# Patient Record
Sex: Female | Born: 1975 | Race: White | Hispanic: No | Marital: Married | Smoking: Never smoker
Health system: Southern US, Community
[De-identification: ages and names within clinical notes are randomized; demographics above are authoritative.]

## PROBLEM LIST (undated history)

## (undated) DIAGNOSIS — E785 Hyperlipidemia, unspecified: Secondary | ICD-10-CM

## (undated) DIAGNOSIS — I10 Essential (primary) hypertension: Secondary | ICD-10-CM

## (undated) HISTORY — DX: Essential (primary) hypertension: I10

## (undated) HISTORY — DX: Hyperlipidemia, unspecified: E78.5

---

## 1999-06-14 ENCOUNTER — Ambulatory Visit: Admission: RE | Admit: 1999-06-14 | Discharge: 1999-06-14 | Payer: Self-pay | Admitting: Pulmonary Disease

## 2012-10-05 ENCOUNTER — Telehealth: Payer: Self-pay | Admitting: Nurse Practitioner

## 2012-10-05 NOTE — Telephone Encounter (Signed)
APPT MADE FOR TOMORROW AM WITH [facility]

## 2012-10-06 ENCOUNTER — Ambulatory Visit (INDEPENDENT_AMBULATORY_CARE_PROVIDER_SITE_OTHER): Payer: BC Managed Care – PPO | Admitting: General Practice

## 2012-10-06 ENCOUNTER — Ambulatory Visit (INDEPENDENT_AMBULATORY_CARE_PROVIDER_SITE_OTHER): Payer: BC Managed Care – PPO

## 2012-10-06 ENCOUNTER — Encounter: Payer: Self-pay | Admitting: General Practice

## 2012-10-06 VITALS — BP 136/81 | HR 76 | Temp 96.8°F | Ht 63.0 in | Wt 236.0 lb

## 2012-10-06 DIAGNOSIS — M25569 Pain in unspecified knee: Secondary | ICD-10-CM

## 2012-10-06 DIAGNOSIS — M25561 Pain in right knee: Secondary | ICD-10-CM

## 2012-10-06 MED ORDER — LISINOPRIL 10 MG PO TABS: 10.0000 mg | ORAL_TABLET | Freq: Every day | ORAL | Status: DC

## 2012-10-06 MED ORDER — IBUPROFEN 600 MG PO TABS: 600.0000 mg | ORAL_TABLET | Freq: Two times a day (BID) | ORAL | Status: DC | PRN

## 2012-10-06 MED ORDER — SIMVASTATIN 40 MG PO TABS: 40.0000 mg | ORAL_TABLET | Freq: Every evening | ORAL | Status: DC

## 2012-10-06 NOTE — Progress Notes (Signed)
  Subjective:    Patient ID: Tamara Macias, female    DOB: Nov 09, 1975, 37 y.o.   MRN: [id number]  Knee Pain  The incident occurred more than 1 week ago. The incident occurred at home. The injury mechanism was a fall. The pain is present in the right knee. The quality of the pain is described as aching (sharp at times). The pain is at a severity of 7/10. The pain is moderate. The pain has been constant since onset. Associated symptoms include an inability to bear weight, muscle weakness and numbness. Pertinent negatives include no loss of motion. The symptoms are aggravated by movement. She has tried NSAIDs and heat for the symptoms. The treatment provided mild relief.      Review of Systems  Constitutional: Negative for fever and chills.  Respiratory: Negative for chest tightness and shortness of breath.   Cardiovascular: Negative for chest pain and palpitations.  Musculoskeletal: Negative for back pain and joint swelling.       Right knee pain  Skin: Negative.  Negative for rash.  Neurological: Positive for numbness.       Objective:   Physical Exam  Constitutional: She is oriented to person, place, and time. She appears well-developed and well-nourished.  Cardiovascular: Normal rate, regular rhythm and normal heart sounds.   Pulmonary/Chest: Effort normal and breath sounds normal. No respiratory distress. She exhibits no tenderness.  Musculoskeletal: She exhibits tenderness.       Right knee: She exhibits normal range of motion, no swelling and no erythema. Tenderness found.  Right knee pain area pain with flexion and extension of knee. Tenderness to inner knee along and hamstring area upon palpation  Neurological: She is alert and oriented to person, place, and time.  Skin: Skin is warm and dry.  Psychiatric: She has a normal mood and affect.   [facility] reading (PRIMARY) by Ruthell Rummage, FNP-C, no dislocation or fracture noted.                                      Assessment &  Plan:  Take medications as prescribed Rest, ice, elevate, and compression wrap on right knee RTO if symptoms worsens or unresolved in 2 weeks Patient verbalized understanding and denies questions   Raymon Mutton, FNP-C

## 2012-10-06 NOTE — Patient Instructions (Addendum)
Muscle Strain Muscle strain occurs when a muscle is stretched beyond its normal length. A small number of muscle fibers generally are torn. This is especially common in athletes. This happens when a sudden, violent force placed on a muscle stretches it too far. Usually, recovery from muscle strain takes 1 to 2 weeks. Complete healing will take 5 to 6 weeks.  HOME CARE INSTRUCTIONS   While awake, apply ice to the sore muscle for the first 2 days after the injury.  Put ice in a plastic bag.  Place a towel between your skin and the bag.  Leave the ice on for 15 to 20 minutes each hour.  Do not use the strained muscle for several days, until you no longer have pain.  You may wrap the injured area with an elastic bandage for comfort. Be careful not to wrap it too tightly. This may interfere with blood circulation or increase swelling.  Only take over-the-counter or prescription medicines for pain, discomfort, or fever as directed by your caregiver. SEEK MEDICAL CARE IF:  You have increasing pain or swelling in the injured area. MAKE SURE YOU:   Understand these instructions.  Will watch your condition.  Will get help right away if you are not doing well or get worse. Document Released: 06/17/2005 Document Revised: 09/09/2011 Document Reviewed: 06/29/2011 [facility] Patient Information 2013 [place], [place]. Knee Pain The knee is the complex joint between your thigh and your lower leg. It is made up of bones, tendons, ligaments, and cartilage. The bones that make up the knee are:  The femur in the thigh.  The tibia and fibula in the lower leg.  The patella or kneecap riding in the groove on the lower femur. CAUSES  Knee pain is a common complaint with many causes. A few of these causes are:  Injury, such as:  A ruptured ligament or tendon injury.  Torn cartilage.  Medical conditions, such as:  Gout  Arthritis  Infections  Overuse, over training or overdoing a physical  activity. Knee pain can be minor or severe. Knee pain can accompany debilitating injury. Minor knee problems often respond well to self-care measures or get well on their own. More serious injuries may need medical intervention or even surgery. SYMPTOMS The knee is complex. Symptoms of knee problems can vary widely. Some of the problems are:  Pain with movement and weight bearing.  Swelling and tenderness.  Buckling of the knee.  Inability to straighten or extend your knee.  Your knee locks and you cannot straighten it.  Warmth and redness with pain and fever.  Deformity or dislocation of the kneecap. DIAGNOSIS  Determining what is wrong may be very straight forward such as when there is an injury. It can also be challenging because of the complexity of the knee. Tests to make a diagnosis may include:  Your caregiver taking a history and doing a physical exam.  Routine X-rays can be used to rule out other problems. X-rays will not reveal a cartilage tear. Some injuries of the knee can be diagnosed by:  Arthroscopy a surgical technique by which a small video camera is inserted through tiny incisions on the sides of the knee. This procedure is used to examine and repair internal knee joint problems. Tiny instruments can be used during arthroscopy to repair the torn knee cartilage (meniscus).  Arthrography is a radiology technique. A contrast liquid is directly injected into the knee joint. Internal structures of the knee joint then become visible on X-ray  film.  An MRI scan is a non x-ray radiology procedure in which magnetic fields and a computer produce two- or three-dimensional images of the inside of the knee. Cartilage tears are often visible using an MRI scanner. MRI scans have largely replaced arthrography in diagnosing cartilage tears of the knee.  Blood work.  Examination of the fluid that helps to lubricate the knee joint (synovial fluid). This is done by taking a sample out  using a needle and a syringe. TREATMENT The treatment of knee problems depends on the cause. Some of these treatments are:  Depending on the injury, proper casting, splinting, surgery or physical therapy care will be needed.  Give yourself adequate recovery time. Do not overuse your joints. If you begin to get sore during workout routines, back off. Slow down or do fewer repetitions.  For repetitive activities such as cycling or running, maintain your strength and nutrition.  Alternate muscle groups. For example if you are a weight lifter, work the upper body on one day and the lower body the next.  Either tight or weak muscles do not give the proper support for your knee. Tight or weak muscles do not absorb the stress placed on the knee joint. Keep the muscles surrounding the knee strong.  Take care of mechanical problems.  If you have flat feet, orthotics or special shoes may help. See your caregiver if you need help.  Arch supports, sometimes with wedges on the inner or outer aspect of the heel, can help. These can shift pressure away from the side of the knee most bothered by osteoarthritis.  A brace called an "unloader" brace also may be used to help ease the pressure on the most arthritic side of the knee.  If your caregiver has prescribed crutches, braces, wraps or ice, use as directed. The acronym for this is PRICE. This means protection, rest, ice, compression and elevation.  Nonsteroidal anti-inflammatory drugs (NSAID's), can help relieve pain. But if taken immediately after an injury, they may actually increase swelling. Take NSAID's with food in your stomach. Stop them if you develop stomach problems. Do not take these if you have a history of ulcers, stomach pain or bleeding from the bowel. Do not take without your caregiver's approval if you have problems with fluid retention, heart failure, or kidney problems.  For ongoing knee problems, physical therapy may be  helpful.  Glucosamine and chondroitin are over-the-counter dietary supplements. Both may help relieve the pain of osteoarthritis in the knee. These medicines are different from the usual anti-inflammatory drugs. Glucosamine may decrease the rate of cartilage destruction.  Injections of a corticosteroid drug into your knee joint may help reduce the symptoms of an arthritis flare-up. They may provide pain relief that lasts a few months. You may have to wait a few months between injections. The injections do have a small increased risk of infection, water retention and elevated blood sugar levels.  Hyaluronic acid injected into damaged joints may ease pain and provide lubrication. These injections may work by reducing inflammation. A series of shots may give relief for as long as 6 months.  Topical painkillers. Applying certain ointments to your skin may help relieve the pain and stiffness of osteoarthritis. Ask your pharmacist for suggestions. Many over the-counter products are approved for temporary relief of arthritis pain.  In some countries, doctors often prescribe topical NSAID's for relief of chronic conditions such as arthritis and tendinitis. A review of treatment with NSAID creams found that they worked as  well as oral medications but without the serious side effects. PREVENTION  Maintain a healthy weight. Extra pounds put more strain on your joints.  Get strong, stay limber. Weak muscles are a common cause of knee injuries. Stretching is important. Include flexibility exercises in your workouts.  Be smart about exercise. If you have osteoarthritis, chronic knee pain or recurring injuries, you may need to change the way you exercise. This does not mean you have to stop being active. If your knees ache after jogging or playing basketball, consider switching to swimming, water aerobics or other low-impact activities, at least for a few days a week. Sometimes limiting high-impact activities will  provide relief.  Make sure your shoes fit well. Choose footwear that is right for your sport.  Protect your knees. Use the proper gear for knee-sensitive activities. Use kneepads when playing volleyball or laying carpet. Buckle your seat belt every time you drive. Most shattered kneecaps occur in car accidents.  Rest when you are tired. SEEK MEDICAL CARE IF:  You have knee pain that is continual and does not seem to be getting better.  SEEK IMMEDIATE MEDICAL CARE IF:  Your knee joint feels hot to the touch and you have a high fever. MAKE SURE YOU:   Understand these instructions.  Will watch your condition.  Will get help right away if you are not doing well or get worse. Document Released: 04/14/2007 Document Revised: 09/09/2011 Document Reviewed: 04/14/2007 [facility] Patient Information 2013 [place], [place].

## 2012-11-10 ENCOUNTER — Telehealth: Payer: Self-pay | Admitting: Nurse Practitioner

## 2012-11-10 NOTE — Telephone Encounter (Signed)
APPT MADE

## 2012-11-11 ENCOUNTER — Ambulatory Visit (INDEPENDENT_AMBULATORY_CARE_PROVIDER_SITE_OTHER): Payer: BC Managed Care – PPO | Admitting: General Practice

## 2012-11-11 ENCOUNTER — Encounter: Payer: Self-pay | Admitting: General Practice

## 2012-11-11 VITALS — BP 131/84 | HR 103 | Temp 98.8°F | Ht 63.0 in | Wt 235.0 lb

## 2012-11-11 DIAGNOSIS — M25561 Pain in right knee: Secondary | ICD-10-CM

## 2012-11-11 DIAGNOSIS — M25569 Pain in unspecified knee: Secondary | ICD-10-CM

## 2012-11-11 DIAGNOSIS — IMO0002 Reserved for concepts with insufficient information to code with codable children: Secondary | ICD-10-CM

## 2012-11-11 DIAGNOSIS — S86911S Strain of unspecified muscle(s) and tendon(s) at lower leg level, right leg, sequela: Secondary | ICD-10-CM

## 2012-11-11 MED ORDER — CYCLOBENZAPRINE HCL 5 MG PO TABS: 5.0000 mg | ORAL_TABLET | Freq: Three times a day (TID) | ORAL | Status: DC | PRN

## 2012-11-11 NOTE — Progress Notes (Signed)
  Subjective:    Patient ID: Tamara Macias, female    DOB: [birth year], 37 y.o.   MRN: [id number]  HPI Patient presents today complaining of right knee pain. Patient was seen in this office on 10/06/12. Afterwards seen in urgent care. Reports some improvement with previous treatment, but pain is still present. Reports following prescribed treatment.     Review of Systems  Constitutional: Negative for fever and chills.  Respiratory: Negative for chest tightness and shortness of breath.   Cardiovascular: Negative for chest pain.  Musculoskeletal:       Right knee pain       Objective:   Physical Exam  Constitutional: She is oriented to person, place, and time. She appears well-developed and well-nourished.  Cardiovascular: Normal rate, regular rhythm and normal heart sounds.   Pulmonary/Chest: Effort normal and breath sounds normal. No respiratory distress. She exhibits no tenderness.  Musculoskeletal: She exhibits tenderness. She exhibits no edema.  Right knee pain area pain with flexion and extension of knee. Tenderness to inner knee along and hamstring area and patella upon palpation   Neurological: She is alert and oriented to person, place, and time.  Skin: Skin is warm and dry.  Psychiatric: She has a normal mood and affect.          Assessment & Plan:  1. Right knee pain - Ambulatory referral to Orthopedic Surgery  2. Muscle strain of knee, right, sequela - cyclobenzaprine (FLEXERIL) 5 MG tablet; Take 1 tablet (5 mg total) by mouth 3 (three) times daily as needed for muscle spasms.  Dispense: 30 tablet; Refill: 0  RICE instructions for injury Refrain from strenuous activity of right knee Sedation precautions RTO if symptoms worsen prior to ortho appointment Patient verbalized understanding Coralie Keens, FNP-C

## 2012-11-12 ENCOUNTER — Telehealth: Payer: Self-pay | Admitting: General Practice

## 2012-11-13 ENCOUNTER — Encounter: Payer: Self-pay | Admitting: General Practice

## 2012-11-13 NOTE — Telephone Encounter (Signed)
Please inform patient that letter for light duty has been sent to place of employment. thx

## 2012-11-13 NOTE — Telephone Encounter (Signed)
Patient states she really needs this for work. I advised that we will get it as soon as we can.

## 2012-11-13 NOTE — Telephone Encounter (Signed)
Please let patient know I will complete for to indicate her limitations to submit to her employer. thx

## 2012-11-13 NOTE — Telephone Encounter (Signed)
The patient called back stating that she has to turn that into her employer by 5pm today. I asked the patient to call her employer and get a fax number and check to see if we can fax it directly to them. Will we be able to do that by 5 today?

## 2012-11-14 NOTE — Telephone Encounter (Signed)
LMOM, note sent to work for light duty, per M. [employer].

## 2012-11-16 ENCOUNTER — Telehealth: Payer: Self-pay | Admitting: General Practice

## 2012-11-16 NOTE — Telephone Encounter (Signed)
FYI

## 2012-11-16 NOTE — Telephone Encounter (Signed)
Pt came by and received note

## 2012-11-17 ENCOUNTER — Other Ambulatory Visit: Payer: Self-pay | Admitting: General Practice

## 2012-11-19 NOTE — Telephone Encounter (Signed)
Seen by [facility] 11/11/12

## 2012-12-04 ENCOUNTER — Other Ambulatory Visit: Payer: Self-pay | Admitting: Nurse Practitioner

## 2012-12-07 ENCOUNTER — Other Ambulatory Visit: Payer: Self-pay | Admitting: *Deleted

## 2012-12-07 MED ORDER — SIMVASTATIN 40 MG PO TABS: 40.0000 mg | ORAL_TABLET | Freq: Every evening | ORAL | Status: DC

## 2012-12-07 NOTE — Telephone Encounter (Signed)
LAST LABS 3/13. NTBS.

## 2013-01-04 ENCOUNTER — Other Ambulatory Visit: Payer: Self-pay | Admitting: Physician Assistant

## 2013-01-06 NOTE — Telephone Encounter (Signed)
LAST LABS 3/13. NTBS

## 2013-03-08 ENCOUNTER — Encounter: Payer: Self-pay | Admitting: Family Medicine

## 2013-03-08 ENCOUNTER — Ambulatory Visit (INDEPENDENT_AMBULATORY_CARE_PROVIDER_SITE_OTHER): Payer: BC Managed Care – PPO | Admitting: Family Medicine

## 2013-03-08 VITALS — BP 136/93 | HR 81 | Temp 97.9°F | Ht 63.0 in | Wt 234.0 lb

## 2013-03-08 DIAGNOSIS — B079 Viral wart, unspecified: Secondary | ICD-10-CM

## 2013-03-08 NOTE — Patient Instructions (Signed)
Cryotherapy Cryotherapy means treatment with cold. Ice or gel packs can be used to reduce both pain and swelling. Ice is the most helpful within the first 24 to 48 hours after an injury or flareup from overusing a muscle or joint. Sprains, strains, spasms, burning pain, shooting pain, and aches can all be eased with ice. Ice can also be used when recovering from surgery. Ice is effective, has very few side effects, and is safe for most people to use. PRECAUTIONS  Ice is not a safe treatment option for people with:  Raynaud's phenomenon. This is a condition affecting small blood vessels in the extremities. Exposure to cold may cause your problems to return.  Cold hypersensitivity. There are many forms of cold hypersensitivity, including:  Cold urticaria. Red, itchy hives appear on the skin when the tissues begin to warm after being iced.  Cold erythema. This is a red, itchy rash caused by exposure to cold.  Cold hemoglobinuria. Red blood cells break down when the tissues begin to warm after being iced. The hemoglobin that carry oxygen are passed into the urine because they cannot combine with blood proteins fast enough.  Numbness or altered sensitivity in the area being iced. If you have any of the following conditions, do not use ice until you have discussed cryotherapy with your caregiver:  Heart conditions, such as arrhythmia, angina, or chronic heart disease.  High blood pressure.  Healing wounds or open skin in the area being iced.  Current infections.  Rheumatoid arthritis.  Poor circulation.  Diabetes. Ice slows the blood flow in the region it is applied. This is beneficial when trying to stop inflamed tissues from spreading irritating chemicals to surrounding tissues. However, if you expose your skin to cold temperatures for too long or without the proper protection, you can damage your skin or nerves. Watch for signs of skin damage due to cold. HOME CARE INSTRUCTIONS Follow  these tips to use ice and cold packs safely.  Place a dry or damp towel between the ice and skin. A damp towel will cool the skin more quickly, so you may need to shorten the time that the ice is used.  For a more rapid response, add gentle compression to the ice.  Ice for no more than 10 to 20 minutes at a time. The bonier the area you are icing, the less time it will take to get the benefits of ice.  Check your skin after 5 minutes to make sure there are no signs of a poor response to cold or skin damage.  Rest 20 minutes or more in between uses.  Once your skin is numb, you can end your treatment. You can test numbness by very lightly touching your skin. The touch should be so light that you do not see the skin dimple from the pressure of your fingertip. When using ice, most people will feel these normal sensations in this order: cold, burning, aching, and numbness.  Do not use ice on someone who cannot communicate their responses to pain, such as small children or people with dementia. HOW TO MAKE AN ICE PACK Ice packs are the most common way to use ice therapy. Other methods include ice massage, ice baths, and cryo-sprays. Muscle creams that cause a cold, tingly feeling do not offer the same benefits that ice offers and should not be used as a substitute unless recommended by your caregiver. To make an ice pack, do one of the following:  Place crushed ice or   a bag of frozen vegetables in a sealable plastic bag. Squeeze out the excess air. Place this bag inside another plastic bag. Slide the bag into a pillowcase or place a damp towel between your skin and the bag.  Mix 3 parts water with 1 part rubbing alcohol. Freeze the mixture in a sealable plastic bag. When you remove the mixture from the freezer, it will be slushy. Squeeze out the excess air. Place this bag inside another plastic bag. Slide the bag into a pillowcase or place a damp towel between your skin and the bag. SEEK MEDICAL  CARE IF:  You develop white spots on your skin. This may give the skin a blotchy (mottled) appearance.  Your skin turns blue or pale.  Your skin becomes waxy or hard.  Your swelling gets worse. MAKE SURE YOU:   Understand these instructions.  Will watch your condition.  Will get help right away if you are not doing well or get worse. Document Released: 02/11/2011 Document Revised: 09/09/2011 Document Reviewed: 02/11/2011 [employer] Patient Information 2014 [employer].  

## 2013-03-08 NOTE — Progress Notes (Signed)
  Subjective:    Patient ID: Tamara Macias, female    DOB: 1975-07-21, 37 y.o.   MRN: [id number]  HPI This 37 y.o. female presents for evaluation of wart removal right hand.   Review of Systems    No chest pain, SOB, HA, dizziness, vision change, N/V, diarrhea, constipation, dysuria, urinary urgency or frequency, myalgias, arthralgias or rash.  Objective:   Physical Exam  Vital signs noted  Well developed well nourished female.  HEENT - Head atraumatic Normocephalic                Eyes - PERRLA, Conjuctiva - clear Sclera- Clear EOMI                Ears - EAC's Wnl TM's Wnl Gross Hearing WNL                Nose - Nares patent                 Throat - oropharanx wnl Respiratory - Lungs CTA bilateral Skin - Wart right thumb base  Procedure - liquid nitrogen sprayed x 2 for 15 seconds      Assessment & Plan:  Wart Cryotherapy to right thumb wart x 2 and discussed applying bandaid if blisters and drains. Follow up in one week if wart persists.

## 2013-06-02 ENCOUNTER — Other Ambulatory Visit: Payer: Self-pay | Admitting: Family Medicine

## 2013-10-06 ENCOUNTER — Other Ambulatory Visit: Payer: Self-pay | Admitting: Nurse Practitioner

## 2013-10-20 ENCOUNTER — Other Ambulatory Visit: Payer: Self-pay | Admitting: Nurse Practitioner

## 2013-10-28 ENCOUNTER — Encounter: Payer: Self-pay | Admitting: Nurse Practitioner

## 2013-10-28 ENCOUNTER — Ambulatory Visit (INDEPENDENT_AMBULATORY_CARE_PROVIDER_SITE_OTHER): Payer: BC Managed Care – PPO | Admitting: Nurse Practitioner

## 2013-10-28 VITALS — BP 161/104 | HR 88 | Temp 98.2°F | Ht 61.0 in | Wt 236.0 lb

## 2013-10-28 DIAGNOSIS — I10 Essential (primary) hypertension: Secondary | ICD-10-CM | POA: Insufficient documentation

## 2013-10-28 DIAGNOSIS — H579 Unspecified disorder of eye and adnexa: Secondary | ICD-10-CM

## 2013-10-28 DIAGNOSIS — E785 Hyperlipidemia, unspecified: Secondary | ICD-10-CM | POA: Insufficient documentation

## 2013-10-28 DIAGNOSIS — H5789 Other specified disorders of eye and adnexa: Secondary | ICD-10-CM

## 2013-10-28 MED ORDER — SIMVASTATIN 40 MG PO TABS: ORAL_TABLET | ORAL | Status: DC

## 2013-10-28 MED ORDER — AZELASTINE HCL 0.05 % OP SOLN: 1.0000 [drp] | Freq: Two times a day (BID) | OPHTHALMIC | Status: DC

## 2013-10-28 MED ORDER — LISINOPRIL 10 MG PO TABS: ORAL_TABLET | ORAL | Status: DC

## 2013-10-28 NOTE — Patient Instructions (Signed)

## 2013-10-28 NOTE — Progress Notes (Signed)
   Subjective:    Patient ID: Tamara Macias, female    DOB: Aug 04, 1975, 38 y.o.   MRN: [id number]  Patient here today for follow up of chronic medical problems.  Hypertension This is a chronic problem. The current episode started more than 1 year ago. The problem is unchanged. The problem is uncontrolled (patient has been out of meds for 1 week.). Pertinent negatives include no blurred vision, chest pain, headaches, palpitations, peripheral edema or shortness of breath. There are no associated agents to hypertension. Risk factors for coronary artery disease include dyslipidemia, family history and obesity. Past treatments include ACE inhibitors. The current treatment provides moderate improvement. Compliance problems include diet and exercise.   Hyperlipidemia This is a chronic problem. The current episode started more than 1 year ago. The problem is uncontrolled. Recent lipid tests were reviewed and are high. Exacerbating diseases include obesity. She has no history of diabetes or hypothyroidism. Pertinent negatives include no chest pain or shortness of breath. Current antihyperlipidemic treatment includes statins (doesn't take meds daily ). The current treatment provides mild improvement of lipids. Compliance problems include adherence to diet and adherence to exercise.  Risk factors for coronary artery disease include dyslipidemia and family history.  Eye irritation Patient works at [employer] and there is a lot of cotton dust in the air which really bothers her eyes- Has used optivar in the past that helps but is currently out.   Review of Systems  Eyes: Negative for blurred vision.  Respiratory: Negative for shortness of breath.   Cardiovascular: Negative for chest pain and palpitations.  Neurological: Negative for headaches.       Objective:   Physical Exam        Assessment & Plan:

## 2013-10-29 LAB — CMP14+EGFR
ALBUMIN: 3.9 g/dL (ref 3.5–5.5)
ALT: 19 IU/L (ref 0–32)
AST: 15 IU/L (ref 0–40)
Albumin/Globulin Ratio: 1.5 (ref 1.1–2.5)
Alkaline Phosphatase: 86 IU/L (ref 39–117)
BILIRUBIN TOTAL: 0.2 mg/dL (ref 0.0–1.2)
BUN/Creatinine Ratio: 21 — ABNORMAL HIGH (ref 8–20)
BUN: 12 mg/dL (ref 6–20)
CHLORIDE: 103 mmol/L (ref 97–108)
CO2: 21 mmol/L (ref 18–29)
CREATININE: 0.57 mg/dL (ref 0.57–1.00)
Calcium: 9.3 mg/dL (ref 8.7–10.2)
GFR calc non Af Amer: 120 mL/min/{1.73_m2} (ref >59)
GFR, EST AFRICAN AMERICAN: 139 mL/min/{1.73_m2} (ref >59)
Globulin, Total: 2.6 g/dL (ref 1.5–4.5)
Glucose: 85 mg/dL (ref 65–99)
Potassium: 4.4 mmol/L (ref 3.5–5.2)
Sodium: 139 mmol/L (ref 134–144)
TOTAL PROTEIN: 6.5 g/dL (ref 6.0–8.5)

## 2013-10-29 LAB — NMR, LIPOPROFILE
Cholesterol: 243 mg/dL — ABNORMAL HIGH (ref <200)
HDL Cholesterol by NMR: 38 mg/dL — ABNORMAL LOW (ref >=40)
HDL Particle Number: 27.9 umol/L — ABNORMAL LOW (ref >=30.5)
LDL Particle Number: 2460 nmol/L — ABNORMAL HIGH (ref <1000)
LDL Size: 20.5 nm (ref >20.5)
LDLC SERPL CALC-MCNC: 152 mg/dL — ABNORMAL HIGH (ref <100)
LP-IR Score: 92 — ABNORMAL HIGH (ref <=45)
Small LDL Particle Number: 1283 nmol/L — ABNORMAL HIGH (ref <=527)
Triglycerides by NMR: 265 mg/dL — ABNORMAL HIGH (ref <150)

## 2013-11-01 ENCOUNTER — Ambulatory Visit (INDEPENDENT_AMBULATORY_CARE_PROVIDER_SITE_OTHER): Payer: BC Managed Care – PPO | Admitting: Family Medicine

## 2013-11-01 ENCOUNTER — Encounter: Payer: Self-pay | Admitting: Family Medicine

## 2013-11-01 ENCOUNTER — Telehealth: Payer: Self-pay | Admitting: Nurse Practitioner

## 2013-11-01 VITALS — BP 136/82 | HR 68 | Temp 97.2°F | Ht 61.0 in | Wt 235.6 lb

## 2013-11-01 DIAGNOSIS — M549 Dorsalgia, unspecified: Secondary | ICD-10-CM

## 2013-11-01 DIAGNOSIS — M25561 Pain in right knee: Secondary | ICD-10-CM

## 2013-11-01 DIAGNOSIS — M25569 Pain in unspecified knee: Secondary | ICD-10-CM

## 2013-11-01 MED ORDER — IBUPROFEN 600 MG PO TABS: 600.0000 mg | ORAL_TABLET | Freq: Two times a day (BID) | ORAL | Status: DC | PRN

## 2013-11-01 MED ORDER — CYCLOBENZAPRINE HCL 5 MG PO TABS: 5.0000 mg | ORAL_TABLET | Freq: Three times a day (TID) | ORAL | Status: DC | PRN

## 2013-11-01 NOTE — Progress Notes (Signed)
   Subjective:    Patient ID: Tamara Macias, female    DOB: October 24, 1975, 38 y.o.   MRN: [id number]  HPI  This 38 y.o. female presents for evaluation of back pain due to pulled muscle.  Review of Systems    No chest pain, SOB, HA, dizziness, vision change, N/V, diarrhea, constipation, dysuria, urinary urgency or frequency, myalgias, arthralgias or rash.  Objective:   Physical Exam   Vital signs noted  Well developed well nourished female.  HEENT - Head atraumatic Normocephalic                Eyes - PERRLA, Conjuctiva - clear Sclera- Clear EOMI                Throat - oropharanx wnl Respiratory - Lungs CTA bilateral Cardiac - RRR S1 and S2 without murmur MS - TTP LS spine     Assessment & Plan:

## 2013-11-01 NOTE — Telephone Encounter (Signed)
appt given for tonight  

## 2013-11-04 ENCOUNTER — Other Ambulatory Visit: Payer: Self-pay | Admitting: Nurse Practitioner

## 2013-11-04 MED ORDER — ATORVASTATIN CALCIUM 40 MG PO TABS: 40.0000 mg | ORAL_TABLET | Freq: Every day | ORAL | Status: DC

## 2013-11-28 ENCOUNTER — Other Ambulatory Visit: Payer: Self-pay | Admitting: Family Medicine

## 2014-03-25 ENCOUNTER — Ambulatory Visit (INDEPENDENT_AMBULATORY_CARE_PROVIDER_SITE_OTHER): Payer: BC Managed Care – PPO | Admitting: Family Medicine

## 2014-03-25 ENCOUNTER — Encounter: Payer: Self-pay | Admitting: Family Medicine

## 2014-03-25 VITALS — BP 134/90 | HR 92 | Temp 97.5°F | Ht 61.0 in | Wt 237.0 lb

## 2014-03-25 DIAGNOSIS — J208 Acute bronchitis due to other specified organisms: Secondary | ICD-10-CM

## 2014-03-25 DIAGNOSIS — J209 Acute bronchitis, unspecified: Secondary | ICD-10-CM

## 2014-03-25 MED ORDER — METHYLPREDNISOLONE ACETATE 80 MG/ML IJ SUSP
80.0000 mg | Freq: Once | INTRAMUSCULAR | Status: AC
  Administered 2014-03-25: 80 mg via INTRAMUSCULAR

## 2014-03-25 MED ORDER — AZITHROMYCIN 250 MG PO TABS: ORAL_TABLET | ORAL | Status: DC

## 2014-03-25 MED ORDER — BENZONATATE 100 MG PO CAPS: 100.0000 mg | ORAL_CAPSULE | Freq: Three times a day (TID) | ORAL | Status: DC | PRN

## 2014-03-25 NOTE — Progress Notes (Signed)
   Subjective:    Patient ID: Tamara Macias, female    DOB: Jun 06, 1976, 38 y.o.   MRN: [id number]  HPI This 38 y.o. female presents for evaluation of sinus congestion and uri symptoms.  She has had these sx's for a week.  She is wheezing and has started using her SABA albuterol.   Review of Systems C/o uri sx's and cough No chest pain, SOB, HA, dizziness, vision change, N/V, diarrhea, constipation, dysuria, urinary urgency or frequency, myalgias, arthralgias or rash.     Objective:   Physical Exam Vital signs noted  Well developed well nourished female.  HEENT - Head atraumatic Normocephalic                Eyes - PERRLA, Conjuctiva - clear Sclera- Clear EOMI                Ears - EAC's Wnl TM's Wnl Gross Hearing WNL                Throat - oropharanx wnl Respiratory - Lungs with expiratory wheezes Cardiac - RRR S1 and S2 without murmur GI - Abdomen soft Nontender and bowel sounds active x 4 Extremities - No edema. Neuro - Grossly intact.       Assessment & Plan:  Acute bronchitis due to other specified organisms - Plan: methylPREDNISolone acetate (DEPO-MEDROL) injection 80 mg.  Zpak as directed.  Tessalon perles, albuterol MDI. Push po fluids, rest, tylenol and motrin otc prn as directed for fever, arthralgias, and myalgias.  Follow up prn if sx's continue or persist.  Deatra Canter FNP

## 2014-05-03 ENCOUNTER — Other Ambulatory Visit: Payer: Self-pay | Admitting: *Deleted

## 2014-05-03 MED ORDER — LISINOPRIL 10 MG PO TABS: ORAL_TABLET | ORAL | Status: DC

## 2014-06-28 IMAGING — CR DG KNEE 1-2V*R*
2 series · 2 of 2 positions shown · non-contrast
Comparison: None.

CLINICAL DATA: Right knee pain.  Fall.

RIGHT KNEE - 1-2 VIEW

[view not recorded (1 of 2)]
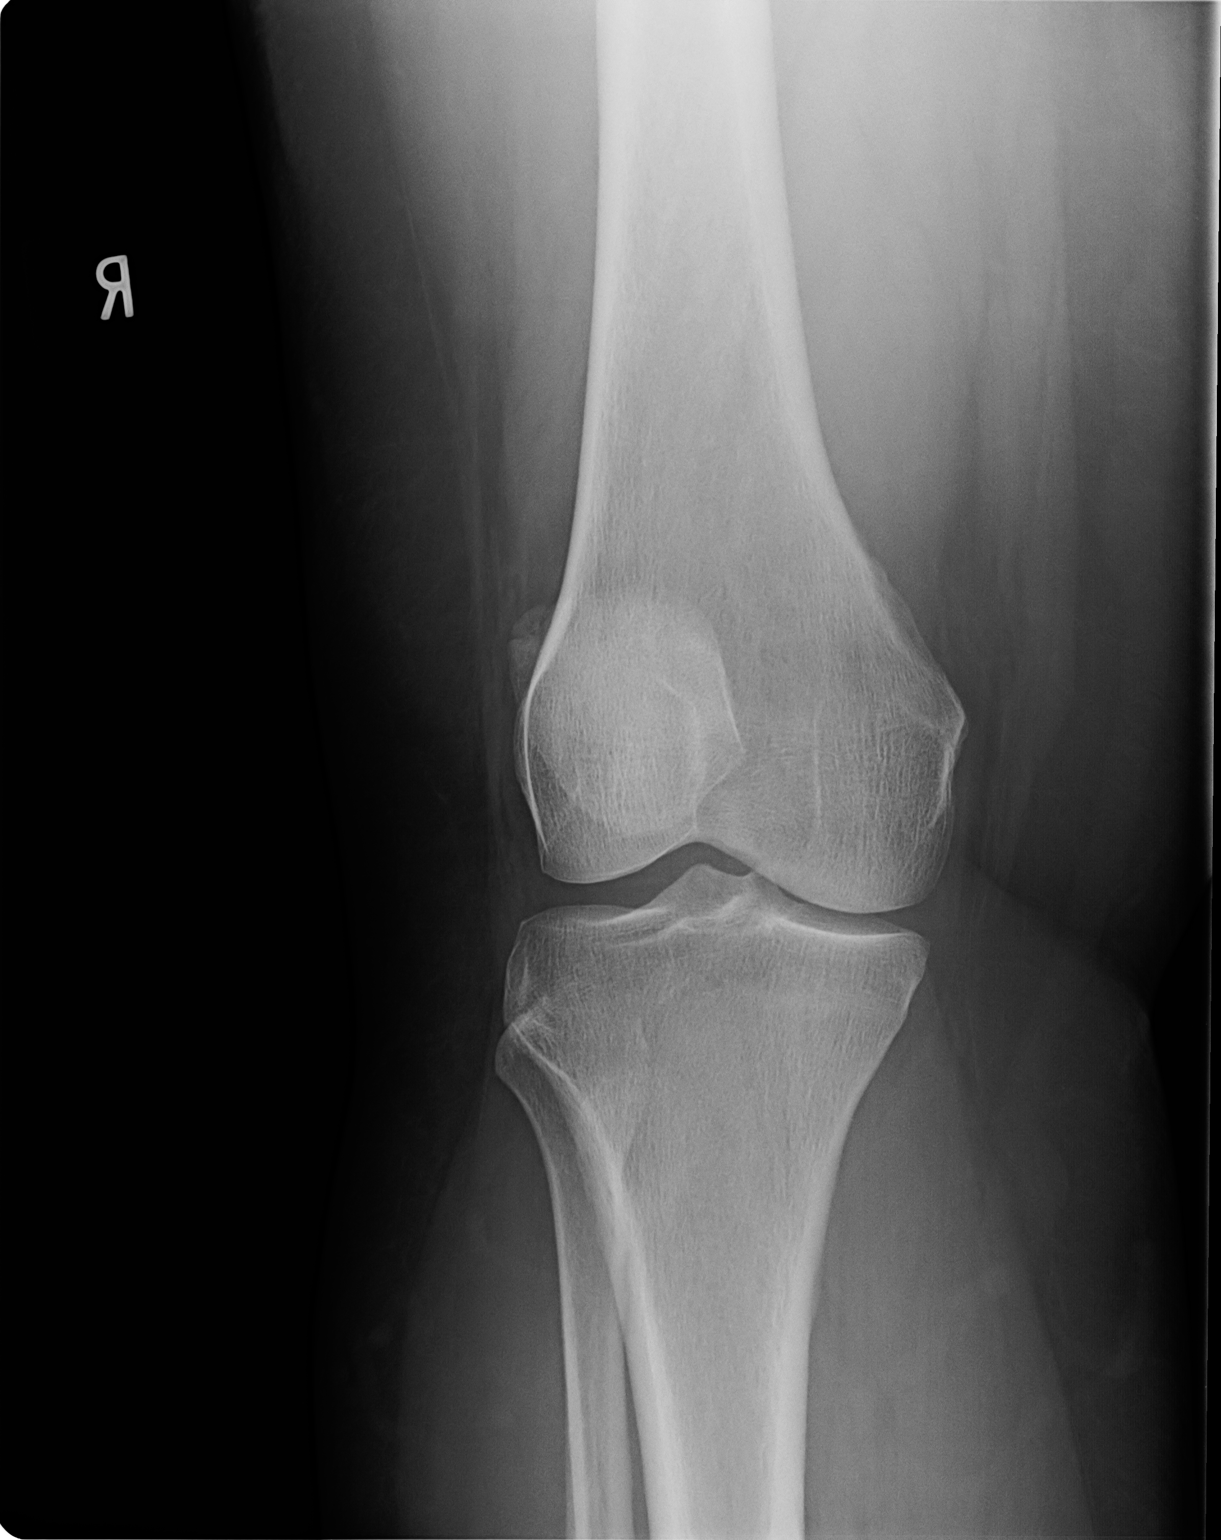

[view not recorded (2 of 2)]
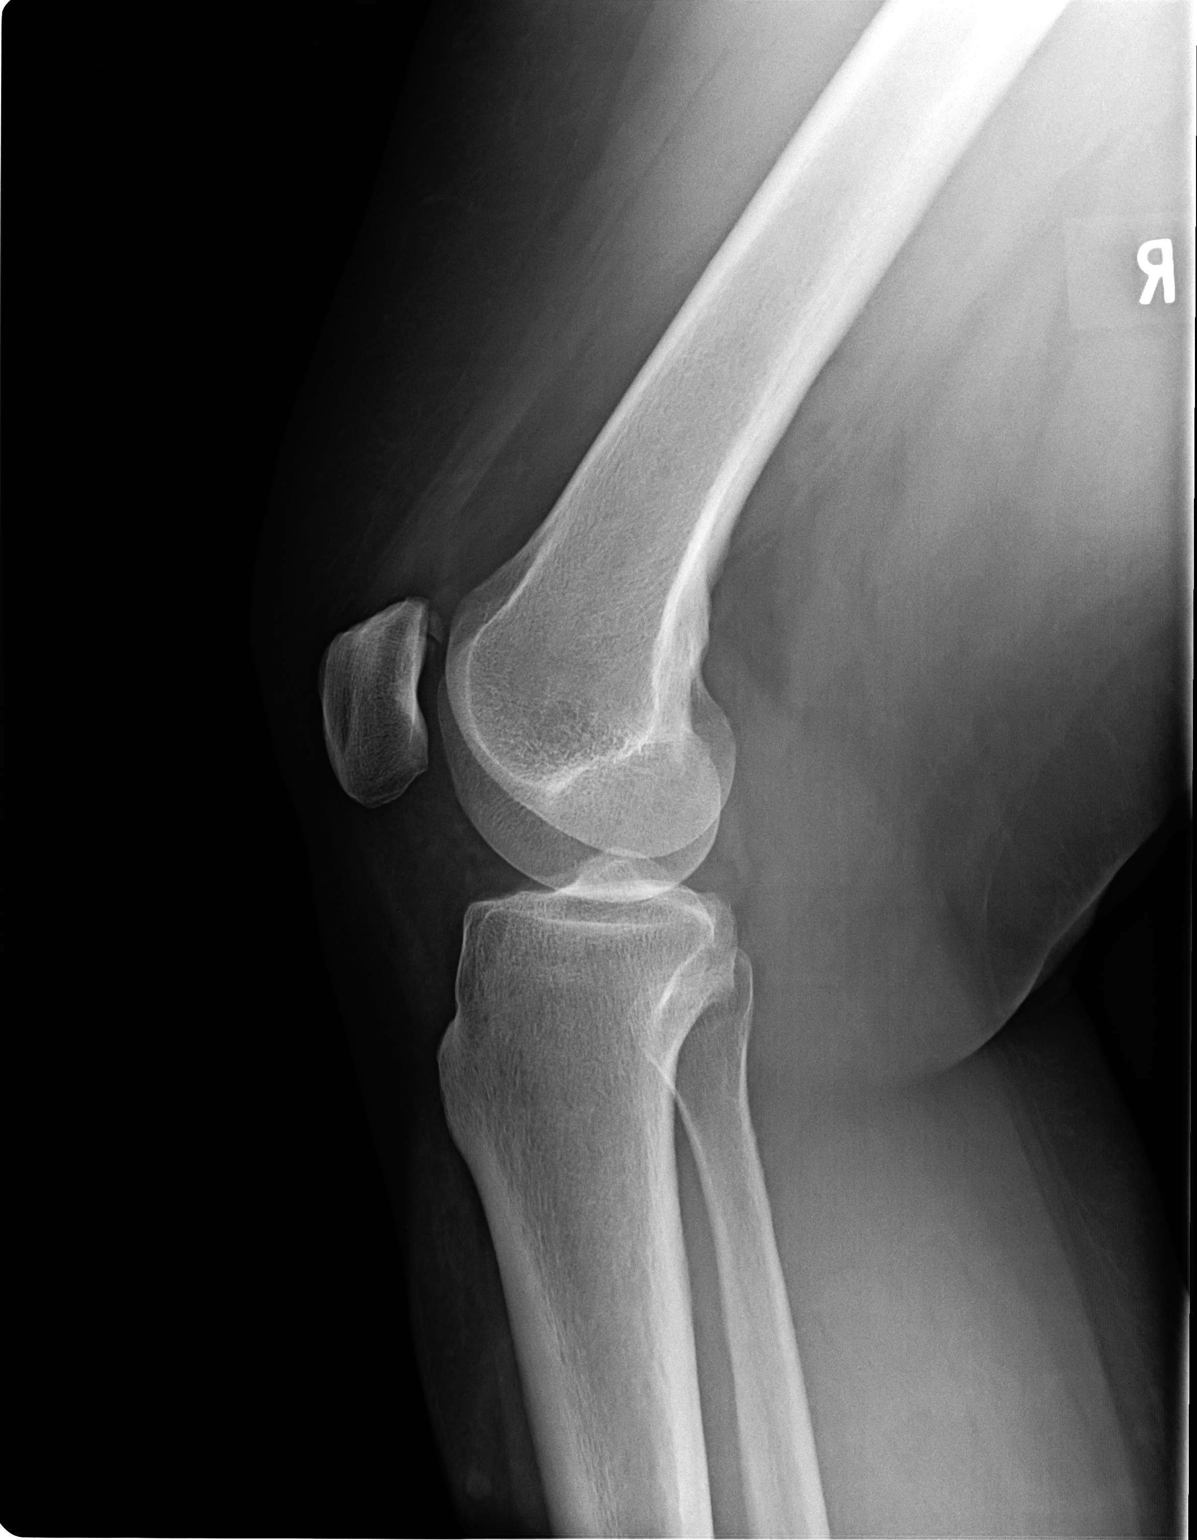

[2 of 2 positions shown; findings below may reference images not displayed]

FINDINGS: AP and lateral views.  There is mild osseous irregularity
about the superolateral aspect of the patella on the frontal.  This
is favored to be degenerative.  There is a prominent posterior
osteophyte of the patella upper pole on the lateral view.  No joint
effusion.
IMPRESSION: Osseous irregularity about the patella, favored to be due to remote
trauma and/or degenerative change.  If there is point tenderness
over the patella, dedicated four view radiographs should be
considered.  No joint effusion to strongly suggest acute injury.

## 2014-08-04 ENCOUNTER — Other Ambulatory Visit: Payer: Self-pay | Admitting: Family Medicine

## 2014-08-05 ENCOUNTER — Other Ambulatory Visit: Payer: Self-pay | Admitting: Family Medicine

## 2014-08-08 NOTE — Telephone Encounter (Signed)
Last seen 03/25/14 B [place]

## 2014-08-21 ENCOUNTER — Other Ambulatory Visit: Payer: Self-pay | Admitting: Family Medicine

## 2014-09-05 ENCOUNTER — Other Ambulatory Visit: Payer: Self-pay | Admitting: Family Medicine

## 2014-10-04 ENCOUNTER — Other Ambulatory Visit: Payer: Self-pay | Admitting: Nurse Practitioner

## 2014-11-02 ENCOUNTER — Other Ambulatory Visit: Payer: Self-pay | Admitting: Nurse Practitioner

## 2014-11-09 ENCOUNTER — Encounter: Payer: Self-pay | Admitting: Family Medicine

## 2014-11-09 ENCOUNTER — Encounter (INDEPENDENT_AMBULATORY_CARE_PROVIDER_SITE_OTHER): Payer: Self-pay

## 2014-11-09 ENCOUNTER — Ambulatory Visit (INDEPENDENT_AMBULATORY_CARE_PROVIDER_SITE_OTHER): Payer: BLUE CROSS/BLUE SHIELD | Admitting: Family Medicine

## 2014-11-09 VITALS — BP 117/75 | HR 78 | Temp 97.6°F | Ht 61.0 in | Wt 239.8 lb

## 2014-11-09 DIAGNOSIS — J45909 Unspecified asthma, uncomplicated: Secondary | ICD-10-CM | POA: Insufficient documentation

## 2014-11-09 DIAGNOSIS — E785 Hyperlipidemia, unspecified: Secondary | ICD-10-CM | POA: Diagnosis not present

## 2014-11-09 DIAGNOSIS — I10 Essential (primary) hypertension: Secondary | ICD-10-CM

## 2014-11-09 DIAGNOSIS — J4520 Mild intermittent asthma, uncomplicated: Secondary | ICD-10-CM

## 2014-11-09 MED ORDER — LISINOPRIL 10 MG PO TABS: 10.0000 mg | ORAL_TABLET | Freq: Every day | ORAL | Status: DC

## 2014-11-09 MED ORDER — ROSUVASTATIN CALCIUM 10 MG PO TABS: 10.0000 mg | ORAL_TABLET | Freq: Every day | ORAL | Status: DC

## 2014-11-09 MED ORDER — ALBUTEROL SULFATE HFA 108 (90 BASE) MCG/ACT IN AERS: 1.0000 | INHALATION_SPRAY | Freq: Four times a day (QID) | RESPIRATORY_TRACT | Status: DC | PRN

## 2014-11-09 NOTE — Progress Notes (Signed)
Subjective:  Patient ID: Tamara Macias, female    DOB: [birth year]  Age: 39 y.o. MRN: [id number]  CC: Hyperlipidemia and Hypertension   HPI Tamara Macias presents for  follow-up of hypertension. Patient has no history of headache chest pain or shortness of breath or recent cough. Patient also denies symptoms of TIA such as numbness weakness lateralizing. Patient checks  blood pressure at home and has not had any elevated readings recently. Patient denies side effects from his medication. States taking it regularly.   History Tamara Macias has a past medical history of Hyperlipidemia and Hypertension.   Tamara Macias has no past surgical history on file.   Her family history includes COPD in her father; Hypertension in her father.Tamara Macias reports that Tamara Macias has never smoked. Tamara Macias does not have any smokeless tobacco history on file. Tamara Macias reports that Tamara Macias does not drink alcohol or use illicit drugs.  Current Outpatient Prescriptions on File Prior to Visit  Medication Sig Dispense Refill  . ibuprofen (ADVIL,MOTRIN) 600 MG tablet TAKE 1 TABLET (600 MG TOTAL)    BY MOUTH 2 (TWO) TIMES DAILY AS NEEDED. 60 tablet 0   No current facility-administered medications on file prior to visit.    ROS Review of Systems  Constitutional: Negative for fever, chills, diaphoresis, appetite change, fatigue and unexpected weight change.  HENT: Negative for congestion, ear pain, hearing loss, postnasal drip, rhinorrhea, sneezing, sore throat and trouble swallowing.   Eyes: Negative for pain.  Respiratory: Negative for cough, chest tightness and shortness of breath.   Cardiovascular: Negative for chest pain and palpitations.  Gastrointestinal: Negative for nausea, vomiting, abdominal pain, diarrhea and constipation.  Genitourinary: Negative for dysuria, frequency and menstrual problem.  Musculoskeletal: Negative for joint swelling and arthralgias.  Skin: Negative for rash.  Neurological: Negative for dizziness, weakness,  numbness and headaches.  Psychiatric/Behavioral: Negative for dysphoric mood and agitation.    Objective:  BP 117/75 mmHg  Pulse 78  Temp(Src) 97.6 F (36.4 C) (Oral)  Ht _0  (1.549 m)  Wt 239 lb 12.8 oz (108.773 kg)  BMI 45.33 kg/m2  LMP 03/11/2014  BP Readings from Last 3 Encounters:  11/09/14 117/75  03/25/14 134/90  11/01/13 136/82    Wt Readings from Last 3 Encounters:  11/09/14 239 lb 12.8 oz (108.773 kg)  03/25/14 237 lb (107.502 kg)  11/01/13 235 lb 9.6 oz (106.867 kg)     Physical Exam  Constitutional: Tamara Macias is oriented to person, place, and time. Tamara Macias appears well-developed and well-nourished. No distress.  HENT:  Head: Normocephalic and atraumatic.  Right Ear: External ear normal.  Left Ear: External ear normal.  Nose: Nose normal.  Mouth/Throat: Oropharynx is clear and moist.  Eyes: Conjunctivae and EOM are normal. Pupils are equal, round, and reactive to light.  Neck: Normal range of motion. Neck supple. No thyromegaly present.  Cardiovascular: Normal rate, regular rhythm and normal heart sounds.   No murmur heard. Pulmonary/Chest: Effort normal and breath sounds normal. No respiratory distress. Tamara Macias has no wheezes. Tamara Macias has no rales.  Abdominal: Soft. Bowel sounds are normal. Tamara Macias exhibits no distension. There is no tenderness.  Lymphadenopathy:    Tamara Macias has no cervical adenopathy.  Neurological: Tamara Macias is alert and oriented to person, place, and time. Tamara Macias has normal reflexes.  Skin: Skin is warm and dry.  Psychiatric: Tamara Macias has a normal mood and affect. Her behavior is normal. Judgment and thought content normal.    No results found for: HGBA1C  Lab Results  Component Value  Date   GLUCOSE 85 10/28/2013   CHOL 243* 10/28/2013   TRIG 265* 10/28/2013   HDL 38* 10/28/2013   LDLCALC 152* 10/28/2013   ALT 19 10/28/2013   AST 15 10/28/2013   NA 139 10/28/2013   K 4.4 10/28/2013   CL 103 10/28/2013   CREATININE 0.57 10/28/2013   BUN 12 10/28/2013   CO2 21  10/28/2013    No results found.  Assessment & Plan:   Tamara Macias was seen today for hyperlipidemia and hypertension.  Diagnoses and all orders for this visit:  Essential hypertension Orders: -     POCT CBC -     CMP14+EGFR  Hyperlipidemia with target LDL less than 100 Orders: -     NMR, lipoprofile  Asthma, chronic, mild intermittent, uncomplicated  Other orders -     lisinopril (PRINIVIL,ZESTRIL) 10 MG tablet; Take 1 tablet (10 mg total) by mouth daily. -     albuterol (PROVENTIL HFA;VENTOLIN HFA) 108 (90 BASE) MCG/ACT inhaler; Inhale 1-2 puffs into the lungs every 6 (six) hours as needed for wheezing or shortness of breath. -     rosuvastatin (CRESTOR) 10 MG tablet; Take 1 tablet (10 mg total) by mouth daily.   I have discontinued Tamara Macias's cholecalciferol, azelastine, azithromycin, and benzonatate. I have also changed her lisinopril and albuterol. Additionally, I am having her start on rosuvastatin. Lastly, I am having her maintain her ibuprofen.  Meds ordered this encounter  Medications  . lisinopril (PRINIVIL,ZESTRIL) 10 MG tablet    Sig: Take 1 tablet (10 mg total) by mouth daily.    Dispense:  30 tablet    Refill:  11  . albuterol (PROVENTIL HFA;VENTOLIN HFA) 108 (90 BASE) MCG/ACT inhaler    Sig: Inhale 1-2 puffs into the lungs every 6 (six) hours as needed for wheezing or shortness of breath.    Dispense:  3.7 g    Refill:  11  . rosuvastatin (CRESTOR) 10 MG tablet    Sig: Take 1 tablet (10 mg total) by mouth daily.    Dispense:  90 tablet    Refill:  3     Follow-up: Return in about 6 weeks (around 12/21/2014).  Claretta Fraise, M.D.

## 2014-11-09 NOTE — Addendum Note (Signed)
Addended by: Tommas OlpHANDY, Jamerius Boeckman N on: 11/09/2014 10:18 AM   Modules accepted: Orders

## 2014-11-09 NOTE — Patient Instructions (Signed)

## 2014-11-10 LAB — NMR, LIPOPROFILE
Cholesterol: 240 mg/dL — ABNORMAL HIGH (ref 100–199)
HDL Cholesterol by NMR: 43 mg/dL (ref >39)
HDL PARTICLE NUMBER: 29.2 umol/L — AB (ref >=30.5)
LDL Particle Number: 2349 nmol/L — ABNORMAL HIGH (ref <1000)
LDL SIZE: 20.3 nm (ref >20.5)
LDL-C: 165 mg/dL — ABNORMAL HIGH (ref 0–99)
LP-IR Score: 79 — ABNORMAL HIGH (ref <=45)
Small LDL Particle Number: 1471 nmol/L — ABNORMAL HIGH (ref <=527)
Triglycerides by NMR: 161 mg/dL — ABNORMAL HIGH (ref 0–149)

## 2014-11-10 LAB — CMP14+EGFR
ALBUMIN: 4.1 g/dL (ref 3.5–5.5)
ALK PHOS: 89 IU/L (ref 39–117)
ALT: 21 IU/L (ref 0–32)
AST: 14 IU/L (ref 0–40)
Albumin/Globulin Ratio: 1.6 (ref 1.1–2.5)
BUN/Creatinine Ratio: 20 (ref 8–20)
BUN: 11 mg/dL (ref 6–20)
Bilirubin Total: 0.4 mg/dL (ref 0.0–1.2)
CALCIUM: 9.5 mg/dL (ref 8.7–10.2)
CHLORIDE: 100 mmol/L (ref 97–108)
CO2: 21 mmol/L (ref 18–29)
Creatinine, Ser: 0.56 mg/dL — ABNORMAL LOW (ref 0.57–1.00)
GFR calc Af Amer: 139 mL/min/{1.73_m2} (ref >59)
GFR calc non Af Amer: 120 mL/min/{1.73_m2} (ref >59)
GLUCOSE: 86 mg/dL (ref 65–99)
Globulin, Total: 2.6 g/dL (ref 1.5–4.5)
Potassium: 4.5 mmol/L (ref 3.5–5.2)
Sodium: 138 mmol/L (ref 134–144)
Total Protein: 6.7 g/dL (ref 6.0–8.5)

## 2014-11-10 LAB — CBC WITH DIFFERENTIAL/PLATELET
Basophils Absolute: 0.1 10*3/uL (ref 0.0–0.2)
Basos: 1 %
EOS (ABSOLUTE): 0.2 10*3/uL (ref 0.0–0.4)
Eos: 3 %
HEMOGLOBIN: 13.5 g/dL (ref 11.1–15.9)
Hematocrit: 41.8 % (ref 34.0–46.6)
Immature Grans (Abs): 0 10*3/uL (ref 0.0–0.1)
Immature Granulocytes: 0 %
LYMPHS ABS: 2.6 10*3/uL (ref 0.7–3.1)
Lymphs: 27 %
MCH: 26.4 pg — AB (ref 26.6–33.0)
MCHC: 32.3 g/dL (ref 31.5–35.7)
MCV: 82 fL (ref 79–97)
Monocytes Absolute: 0.9 10*3/uL (ref 0.1–0.9)
Monocytes: 9 %
NEUTROS PCT: 60 %
Neutrophils Absolute: 5.9 10*3/uL (ref 1.4–7.0)
Platelets: 270 10*3/uL (ref 150–379)
RBC: 5.11 x10E6/uL (ref 3.77–5.28)
RDW: 15.7 % — ABNORMAL HIGH (ref 12.3–15.4)
WBC: 9.7 10*3/uL (ref 3.4–10.8)

## 2014-11-11 ENCOUNTER — Other Ambulatory Visit: Payer: Self-pay | Admitting: Family Medicine

## 2014-11-11 MED ORDER — ROSUVASTATIN CALCIUM 20 MG PO TABS: 20.0000 mg | ORAL_TABLET | Freq: Every day | ORAL | Status: DC

## 2014-12-05 ENCOUNTER — Encounter: Payer: Self-pay | Admitting: Family Medicine

## 2015-01-04 ENCOUNTER — Other Ambulatory Visit: Payer: Self-pay | Admitting: Family Medicine

## 2015-07-14 ENCOUNTER — Other Ambulatory Visit: Payer: Self-pay | Admitting: Family Medicine

## 2015-09-21 ENCOUNTER — Telehealth: Payer: Self-pay | Admitting: Family Medicine

## 2015-09-28 ENCOUNTER — Encounter: Payer: Self-pay | Admitting: Family

## 2015-09-28 ENCOUNTER — Ambulatory Visit (INDEPENDENT_AMBULATORY_CARE_PROVIDER_SITE_OTHER): Payer: BLUE CROSS/BLUE SHIELD | Admitting: Family

## 2015-09-28 VITALS — BP 136/81 | HR 109 | Temp 97.6°F | Ht 61.0 in | Wt 241.2 lb

## 2015-09-28 DIAGNOSIS — Z23 Encounter for immunization: Secondary | ICD-10-CM | POA: Diagnosis not present

## 2015-09-28 DIAGNOSIS — E785 Hyperlipidemia, unspecified: Secondary | ICD-10-CM

## 2015-09-28 DIAGNOSIS — J4520 Mild intermittent asthma, uncomplicated: Secondary | ICD-10-CM

## 2015-09-28 DIAGNOSIS — Z0000 Encounter for general adult medical examination without abnormal findings: Secondary | ICD-10-CM | POA: Diagnosis not present

## 2015-09-28 DIAGNOSIS — Z6841 Body Mass Index (BMI) 40.0 and over, adult: Secondary | ICD-10-CM

## 2015-09-28 DIAGNOSIS — Z01419 Encounter for gynecological examination (general) (routine) without abnormal findings: Secondary | ICD-10-CM | POA: Diagnosis not present

## 2015-09-28 DIAGNOSIS — J309 Allergic rhinitis, unspecified: Secondary | ICD-10-CM

## 2015-09-28 DIAGNOSIS — I10 Essential (primary) hypertension: Secondary | ICD-10-CM

## 2015-09-28 LAB — URINALYSIS, COMPLETE
Bilirubin, UA: NEGATIVE
Glucose, UA: NEGATIVE
Ketones, UA: NEGATIVE
LEUKOCYTES UA: NEGATIVE
Nitrite, UA: NEGATIVE
Protein, UA: NEGATIVE
RBC, UA: NEGATIVE
Specific Gravity, UA: 1.025 (ref 1.005–1.030)
Urobilinogen, Ur: 0.2 mg/dL (ref 0.2–1.0)
pH, UA: 5.5 (ref 5.0–7.5)

## 2015-09-28 LAB — MICROSCOPIC EXAMINATION
Bacteria, UA: NONE SEEN
Epithelial Cells (non renal): >10 /hpf — AB (ref 0–10)
RBC, UA: NONE SEEN /hpf (ref 0–?)

## 2015-09-28 MED ORDER — FLUTICASONE PROPIONATE 50 MCG/ACT NA SUSP: 2.0000 | Freq: Every day | NASAL | Status: DC

## 2015-09-28 NOTE — Addendum Note (Signed)
Addended by: Almeta MonasSTONE, JANIE M on: 09/28/2015 04:30 PM   Modules accepted: Orders, SmartSet

## 2015-09-28 NOTE — Progress Notes (Signed)
Subjective:    Patient ID: Tamara Macias, female    DOB: [birth year], 40 y.o.   MRN: [id number]  Pt presents to the office today for CPE and pap.  Gynecologic Exam  Hypertension This is a chronic problem. The current episode started more than 1 year ago. The problem has been resolved since onset. The problem is controlled. Pertinent negatives include no blurred vision, palpitations or peripheral edema. Risk factors for coronary artery disease include dyslipidemia, obesity, post-menopausal state and sedentary lifestyle. Past treatments include ACE inhibitors. The current treatment provides moderate improvement. There is no history of kidney disease, CAD/MI, CVA, heart failure or a thyroid problem. There is no history of sleep apnea.  Hyperlipidemia This is a chronic problem. The current episode started more than 1 year ago. The problem is uncontrolled. Recent lipid tests were reviewed and are high. Exacerbating diseases include obesity. Pertinent negatives include no myalgias. Current antihyperlipidemic treatment includes diet change. The current treatment provides mild improvement of lipids. Risk factors for coronary artery disease include dyslipidemia, obesity, hypertension and post-menopausal.  Asthma She complains of cough and frequent throat clearing. There is no difficulty breathing or wheezing. This is a chronic problem. The current episode started more than 1 year ago. The problem occurs intermittently. The cough is non-productive. Pertinent negatives include no ear congestion, ear pain or myalgias. Her symptoms are aggravated by occupational exposure. Her symptoms are alleviated by ipratropium. She reports significant improvement on treatment. Her past medical history is significant for asthma.      Review of Systems  Constitutional: Negative.   HENT: Negative.  Negative for ear pain.   Eyes: Negative.  Negative for blurred vision.  Respiratory: Positive for cough. Negative for  wheezing.   Cardiovascular: Negative.  Negative for palpitations.  Gastrointestinal: Negative.   Endocrine: Negative.   Genitourinary: Negative.   Musculoskeletal: Negative.  Negative for myalgias.  Neurological: Negative.   Hematological: Negative.   Psychiatric/Behavioral: Negative.   All other systems reviewed and are negative.      Objective:   Physical Exam  Constitutional: She is oriented to person, place, and time. She appears well-developed and well-nourished. No distress.  HENT:  Head: Normocephalic and atraumatic.  Right Ear: External ear normal.  Left Ear: External ear normal.  Nose: Nose normal.  Mouth/Throat: Oropharynx is clear and moist.  Eyes: Pupils are equal, round, and reactive to light.  Neck: Normal range of motion. Neck supple. No thyromegaly present.  Cardiovascular: Normal rate, regular rhythm, normal heart sounds and intact distal pulses.   No murmur heard. Pulmonary/Chest: Effort normal and breath sounds normal. No respiratory distress. She has no wheezes. Right breast exhibits no inverted nipple, no mass, no nipple discharge, no skin change and no tenderness. Left breast exhibits no inverted nipple, no mass, no nipple discharge, no skin change and no tenderness. Breasts are symmetrical.  Abdominal: Soft. Bowel sounds are normal. She exhibits no distension. There is no tenderness.  Genitourinary: Vagina normal.  Bimanual exam- no adnexal masses or tenderness, ovaries nonpalpable   Cervix parous and pink- No discharge   Musculoskeletal: Normal range of motion. She exhibits no edema or tenderness.  Neurological: She is alert and oriented to person, place, and time. She has normal reflexes. No cranial nerve deficit.  Skin: Skin is warm and dry.  Psychiatric: She has a normal mood and affect. Her behavior is normal. Judgment and thought content normal.  Vitals reviewed.     BP 136/81 mmHg  Pulse 109  Temp(Src) 97.6 F (36.4 C) (Oral)  Ht 5' 1"   (1.549 m)  Wt 241 lb 3.2 oz (109.408 kg)  BMI 45.60 kg/m2  LMP 03/01/2014 (Approximate)     Assessment & Plan:  1. Encounter for routine gynecological examination - Urinalysis, Complete - CMP14+EGFR - Pap IG w/ reflex to HPV when ASC-U  2. Hyperlipidemia with target LDL less than 100 - CMP14+EGFR - Lipid panel  3. Essential hypertension - CMP14+EGFR  4. Asthma, chronic, mild intermittent, uncomplicated - KHV74+BBUY  5. Morbid obesity with BMI of 45.0-49.9, adult (HCC) - CMP14+EGFR  6. Annual physical exam - CMP14+EGFR - Lipid panel - Anemia Profile B - VITAMIN D 25 Hydroxy (Vit-D Deficiency, Fractures) - Thyroid Panel With TSH - Pap IG w/ reflex to HPV when ASC-U   Continue all meds Labs pending Health Maintenance reviewed-TDAP given today Diet and exercise encouraged RTO 6 months  Evelina Dun, FNP

## 2015-09-28 NOTE — Patient Instructions (Signed)
Health Maintenance, Female Adopting a healthy lifestyle and getting preventive care can go a long way to promote health and wellness. Talk with your health care provider about what schedule of regular examinations is right for you. This is a good chance for you to check in with your provider about disease prevention and staying healthy. In between checkups, there are plenty of things you can do on your own. Experts have done a lot of research about which lifestyle changes and preventive measures are most likely to keep you healthy. Ask your health care provider for more information. WEIGHT AND DIET  Eat a healthy diet  Be sure to include plenty of vegetables, fruits, low-fat dairy products, and lean protein.  Do not eat a lot of foods high in solid fats, added sugars, or salt.  Get regular exercise. This is one of the most important things you can do for your health.  Most adults should exercise for at least 150 minutes each week. The exercise should increase your heart rate and make you sweat (moderate-intensity exercise).  Most adults should also do strengthening exercises at least twice a week. This is in addition to the moderate-intensity exercise.  Maintain a healthy weight  Body mass index (BMI) is a measurement that can be used to identify possible weight problems. It estimates body fat based on height and weight. Your health care provider can help determine your BMI and help you achieve or maintain a healthy weight.  For females 20 years of age and older:   A BMI below 18.5 is considered underweight.  A BMI of 18.5 to 24.9 is normal.  A BMI of 25 to 29.9 is considered overweight.  A BMI of 30 and above is considered obese.  Watch levels of cholesterol and blood lipids  You should start having your blood tested for lipids and cholesterol at 38 years of age, then have this test every 5 years.  You may need to have your cholesterol levels checked more often if:  Your lipid  or cholesterol levels are high.  You are older than 38 years of age.  You are at high risk for heart disease.  CANCER SCREENING   Lung Cancer  Lung cancer screening is recommended for adults 55-80 years old who are at high risk for lung cancer because of a history of smoking.  A yearly low-dose CT scan of the lungs is recommended for people who:  Currently smoke.  Have quit within the past 15 years.  Have at least a 30-pack-year history of smoking. A pack year is smoking an average of one pack of cigarettes a day for 1 year.  Yearly screening should continue until it has been 15 years since you quit.  Yearly screening should stop if you develop a health problem that would prevent you from having lung cancer treatment.  Breast Cancer  Practice breast self-awareness. This means understanding how your breasts normally appear and feel.  It also means doing regular breast self-exams. Let your health care provider know about any changes, no matter how small.  If you are in your 20s or 30s, you should have a clinical breast exam (CBE) by a health care provider every 1-3 years as part of a regular health exam.  If you are 40 or older, have a CBE every year. Also consider having a breast X-ray (mammogram) every year.  If you have a family history of breast cancer, talk to your health care provider about genetic screening.  If you   are at high risk for breast cancer, talk to your health care provider about having an MRI and a mammogram every year.  Breast cancer gene (BRCA) assessment is recommended for women who have family members with BRCA-related cancers. BRCA-related cancers include:  Breast.  Ovarian.  Tubal.  Peritoneal cancers.  Results of the assessment will determine the need for genetic counseling and BRCA1 and BRCA2 testing. Cervical Cancer Your health care provider may recommend that you be screened regularly for cancer of the pelvic organs (ovaries, uterus, and  vagina). This screening involves a pelvic examination, including checking for microscopic changes to the surface of your cervix (Pap test). You may be encouraged to have this screening done every 3 years, beginning at age 21.  For women ages 30-65, health care providers may recommend pelvic exams and Pap testing every 3 years, or they may recommend the Pap and pelvic exam, combined with testing for human papilloma virus (HPV), every 5 years. Some types of HPV increase your risk of cervical cancer. Testing for HPV may also be done on women of any age with unclear Pap test results.  Other health care providers may not recommend any screening for nonpregnant women who are considered low risk for pelvic cancer and who do not have symptoms. Ask your health care provider if a screening pelvic exam is right for you.  If you have had past treatment for cervical cancer or a condition that could lead to cancer, you need Pap tests and screening for cancer for at least 20 years after your treatment. If Pap tests have been discontinued, your risk factors (such as having a new sexual partner) need to be reassessed to determine if screening should resume. Some women have medical problems that increase the chance of getting cervical cancer. In these cases, your health care provider may recommend more frequent screening and Pap tests. Colorectal Cancer  This type of cancer can be detected and often prevented.  Routine colorectal cancer screening usually begins at 38 years of age and continues through 38 years of age.  Your health care provider may recommend screening at an earlier age if you have risk factors for colon cancer.  Your health care provider may also recommend using home test kits to check for hidden blood in the stool.  A small camera at the end of a tube can be used to examine your colon directly (sigmoidoscopy or colonoscopy). This is done to check for the earliest forms of colorectal  cancer.  Routine screening usually begins at age 50.  Direct examination of the colon should be repeated every 5-10 years through 38 years of age. However, you may need to be screened more often if early forms of precancerous polyps or small growths are found. Skin Cancer  Check your skin from head to toe regularly.  Tell your health care provider about any new moles or changes in moles, especially if there is a change in a mole's shape or color.  Also tell your health care provider if you have a mole that is larger than the size of a pencil eraser.  Always use sunscreen. Apply sunscreen liberally and repeatedly throughout the day.  Protect yourself by wearing long sleeves, pants, a wide-brimmed hat, and sunglasses whenever you are outside. HEART DISEASE, DIABETES, AND HIGH BLOOD PRESSURE   High blood pressure causes heart disease and increases the risk of stroke. High blood pressure is more likely to develop in:  People who have blood pressure in the high end   of the normal range (130-139/85-89 mm Hg).  People who are overweight or obese.  People who are African American.  If you are 38-23 years of age, have your blood pressure checked every 3-5 years. If you are 61 years of age or older, have your blood pressure checked every year. You should have your blood pressure measured twice--once when you are at a hospital or clinic, and once when you are not at a hospital or clinic. Record the average of the two measurements. To check your blood pressure when you are not at a hospital or clinic, you can use:  An automated blood pressure machine at a pharmacy.  A home blood pressure monitor.  If you are between 45 years and 39 years old, ask your health care provider if you should take aspirin to prevent strokes.  Have regular diabetes screenings. This involves taking a blood sample to check your fasting blood sugar level.  If you are at a normal weight and have a low risk for diabetes,  have this test once every three years after 38 years of age.  If you are overweight and have a high risk for diabetes, consider being tested at a younger age or more often. PREVENTING INFECTION  Hepatitis B  If you have a higher risk for hepatitis B, you should be screened for this virus. You are considered at high risk for hepatitis B if:  You were born in a country where hepatitis B is common. Ask your health care provider which countries are considered high risk.  Your parents were born in a high-risk country, and you have not been immunized against hepatitis B (hepatitis B vaccine).  You have HIV or AIDS.  You use needles to inject street drugs.  You live with someone who has hepatitis B.  You have had sex with someone who has hepatitis B.  You get hemodialysis treatment.  You take certain medicines for conditions, including cancer, organ transplantation, and autoimmune conditions. Hepatitis C  Blood testing is recommended for:  Everyone born from [birth year] through 1963.  Anyone with known risk factors for hepatitis C. Sexually transmitted infections (STIs)  You should be screened for sexually transmitted infections (STIs) including gonorrhea and chlamydia if:  You are sexually active and are younger than 38 years of age.  You are older than 38 years of age and your health care provider tells you that you are at risk for this type of infection.  Your sexual activity has changed since you were last screened and you are at an increased risk for chlamydia or gonorrhea. Ask your health care provider if you are at risk.  If you do not have HIV, but are at risk, it may be recommended that you take a prescription medicine daily to prevent HIV infection. This is called pre-exposure prophylaxis (PrEP). You are considered at risk if:  You are sexually active and do not regularly use condoms or know the HIV status of your partner(s).  You take drugs by injection.  You are sexually  active with a partner who has HIV. Talk with your health care provider about whether you are at high risk of being infected with HIV. If you choose to begin PrEP, you should first be tested for HIV. You should then be tested every 3 months for as long as you are taking PrEP.  PREGNANCY   If you are premenopausal and you may become pregnant, ask your health care provider about preconception counseling.  If you may  become pregnant, take 400 to 800 micrograms (mcg) of folic acid every day.  If you want to prevent pregnancy, talk to your health care provider about birth control (contraception). OSTEOPOROSIS AND MENOPAUSE   Osteoporosis is a disease in which the bones lose minerals and strength with aging. This can result in serious bone fractures. Your risk for osteoporosis can be identified using a bone density scan.  If you are 61 years of age or older, or if you are at risk for osteoporosis and fractures, ask your health care provider if you should be screened.  Ask your health care provider whether you should take a calcium or vitamin D supplement to lower your risk for osteoporosis.  Menopause may have certain physical symptoms and risks.  Hormone replacement therapy may reduce some of these symptoms and risks. Talk to your health care provider about whether hormone replacement therapy is right for you.  HOME CARE INSTRUCTIONS   Schedule regular health, dental, and eye exams.  Stay current with your immunizations.   Do not use any tobacco products including cigarettes, chewing tobacco, or electronic cigarettes.  If you are pregnant, do not drink alcohol.  If you are breastfeeding, limit how much and how often you drink alcohol.  Limit alcohol intake to no more than 1 drink per day for nonpregnant women. One drink equals 12 ounces of beer, 5 ounces of wine, or 1 ounces of hard liquor.  Do not use street drugs.  Do not share needles.  Ask your health care provider for help if  you need support or information about quitting drugs.  Tell your health care provider if you often feel depressed.  Tell your health care provider if you have ever been abused or do not feel safe at home.   This information is not intended to replace advice given to you by your health care provider. Make sure you discuss any questions you have with your health care provider.   Document Released: 12/31/2010 Document Revised: 07/08/2014 Document Reviewed: 05/19/2013 Elsevier Interactive Patient Education [employer].

## 2015-09-28 NOTE — Addendum Note (Signed)
Addended by: Jannifer RodneyHAWKS, Amylynn Fano A on: 09/28/2015 03:51 PM   Modules accepted: Orders, SmartSet

## 2015-09-29 LAB — LIPID PANEL
Chol/HDL Ratio: 5.9 ratio units — ABNORMAL HIGH (ref 0.0–4.4)
Cholesterol, Total: 219 mg/dL — ABNORMAL HIGH (ref 100–199)
HDL: 37 mg/dL — ABNORMAL LOW (ref >39)
LDL Calculated: 143 mg/dL — ABNORMAL HIGH (ref 0–99)
Triglycerides: 194 mg/dL — ABNORMAL HIGH (ref 0–149)
VLDL Cholesterol Cal: 39 mg/dL (ref 5–40)

## 2015-09-29 LAB — ANEMIA PROFILE B
Basophils Absolute: 0 10*3/uL (ref 0.0–0.2)
Basos: 0 %
EOS (ABSOLUTE): 0.2 10*3/uL (ref 0.0–0.4)
EOS: 2 %
Ferritin: 102 ng/mL (ref 15–150)
Folate: 10 ng/mL (ref >3.0)
HEMOGLOBIN: 13.7 g/dL (ref 11.1–15.9)
Hematocrit: 42 % (ref 34.0–46.6)
IMMATURE GRANS (ABS): 0 10*3/uL (ref 0.0–0.1)
IMMATURE GRANULOCYTES: 0 %
IRON SATURATION: 7 % — AB (ref 15–55)
IRON: 23 ug/dL — AB (ref 27–159)
LYMPHS: 19 %
Lymphocytes Absolute: 2.8 10*3/uL (ref 0.7–3.1)
MCH: 26.6 pg (ref 26.6–33.0)
MCHC: 32.6 g/dL (ref 31.5–35.7)
MCV: 82 fL (ref 79–97)
MONOCYTES: 8 %
Monocytes Absolute: 1.2 10*3/uL — ABNORMAL HIGH (ref 0.1–0.9)
Neutrophils Absolute: 10.5 10*3/uL — ABNORMAL HIGH (ref 1.4–7.0)
Neutrophils: 71 %
Platelets: 270 10*3/uL (ref 150–379)
RBC: 5.15 x10E6/uL (ref 3.77–5.28)
RDW: 15.4 % (ref 12.3–15.4)
RETIC CT PCT: 1.6 % (ref 0.6–2.6)
Total Iron Binding Capacity: 315 ug/dL (ref 250–450)
UIBC: 292 ug/dL (ref 131–425)
Vitamin B-12: 378 pg/mL (ref 211–946)
WBC: 14.8 10*3/uL — ABNORMAL HIGH (ref 3.4–10.8)

## 2015-09-29 LAB — CMP14+EGFR
ALK PHOS: 111 IU/L (ref 39–117)
ALT: 17 IU/L (ref 0–32)
AST: 15 IU/L (ref 0–40)
Albumin/Globulin Ratio: 1.6 (ref 1.2–2.2)
Albumin: 4.2 g/dL (ref 3.5–5.5)
BUN/Creatinine Ratio: 14 (ref 8–20)
BUN: 11 mg/dL (ref 6–20)
Bilirubin Total: <0.2 mg/dL (ref 0.0–1.2)
CALCIUM: 9.2 mg/dL (ref 8.7–10.2)
CHLORIDE: 100 mmol/L (ref 96–106)
CO2: 22 mmol/L (ref 18–29)
Creatinine, Ser: 0.76 mg/dL (ref 0.57–1.00)
GFR calc Af Amer: 116 mL/min/{1.73_m2} (ref >59)
GFR, EST NON AFRICAN AMERICAN: 101 mL/min/{1.73_m2} (ref >59)
Globulin, Total: 2.6 g/dL (ref 1.5–4.5)
Glucose: 87 mg/dL (ref 65–99)
POTASSIUM: 4.6 mmol/L (ref 3.5–5.2)
Sodium: 140 mmol/L (ref 134–144)
Total Protein: 6.8 g/dL (ref 6.0–8.5)

## 2015-09-29 LAB — THYROID PANEL WITH TSH
Free Thyroxine Index: 2.3 (ref 1.2–4.9)
T3 UPTAKE RATIO: 25 % (ref 24–39)
T4 TOTAL: 9 ug/dL (ref 4.5–12.0)
TSH: 1.72 u[IU]/mL (ref 0.450–4.500)

## 2015-09-29 LAB — VITAMIN D 25 HYDROXY (VIT D DEFICIENCY, FRACTURES): VIT D 25 HYDROXY: 33.2 ng/mL (ref 30.0–100.0)

## 2015-09-30 ENCOUNTER — Ambulatory Visit (INDEPENDENT_AMBULATORY_CARE_PROVIDER_SITE_OTHER): Payer: BLUE CROSS/BLUE SHIELD | Admitting: Family

## 2015-09-30 VITALS — BP 142/94 | HR 89 | Temp 98.0°F | Ht 61.0 in | Wt 241.0 lb

## 2015-09-30 DIAGNOSIS — J0100 Acute maxillary sinusitis, unspecified: Secondary | ICD-10-CM | POA: Diagnosis not present

## 2015-09-30 DIAGNOSIS — R6889 Other general symptoms and signs: Secondary | ICD-10-CM | POA: Diagnosis not present

## 2015-09-30 LAB — VERITOR FLU A/B WAIVED
INFLUENZA B: NEGATIVE
Influenza A: NEGATIVE

## 2015-09-30 MED ORDER — AMOXICILLIN-POT CLAVULANATE 875-125 MG PO TABS: 1.0000 | ORAL_TABLET | Freq: Two times a day (BID) | ORAL | Status: DC

## 2015-09-30 NOTE — Progress Notes (Signed)
Subjective:    Patient ID: Tamara Macias, female    DOB: 11/05/1975, 40 y.o.   MRN: [id number]  Sinus Problem This is a new problem. The current episode started in the past 7 days. The problem has been waxing and waning since onset. There has been no fever. Her pain is at a severity of 5/10. The pain is mild. Associated symptoms include chills, congestion, coughing, headaches, a hoarse voice, sinus pressure, sneezing and a sore throat. Pertinent negatives include no ear pain, neck pain or shortness of breath. Past treatments include lying down and oral decongestants. The treatment provided mild relief.  Cough Associated symptoms include chills, headaches and a sore throat. Pertinent negatives include no ear pain or shortness of breath.      Review of Systems  Constitutional: Positive for chills.  HENT: Positive for congestion, hoarse voice, sinus pressure, sneezing and sore throat. Negative for ear pain.   Eyes: Negative.   Respiratory: Positive for cough. Negative for shortness of breath.   Cardiovascular: Negative.  Negative for palpitations.  Gastrointestinal: Negative.   Endocrine: Negative.   Genitourinary: Negative.   Musculoskeletal: Negative.  Negative for neck pain.  Neurological: Positive for headaches.  Hematological: Negative.   Psychiatric/Behavioral: Negative.   All other systems reviewed and are negative.      Objective:   Physical Exam  Constitutional: She is oriented to person, place, and time. She appears well-developed and well-nourished. No distress.  HENT:  Head: Normocephalic and atraumatic.  Right Ear: External ear normal.  Left Ear: External ear normal.  Nose: Right sinus exhibits maxillary sinus tenderness. Right sinus exhibits no frontal sinus tenderness. Left sinus exhibits maxillary sinus tenderness. Left sinus exhibits no frontal sinus tenderness.  Nasal passage erythemas with mild swelling  Oropharynx erythemas  Eyes: Pupils are equal,  round, and reactive to light.  Neck: Normal range of motion. Neck supple. No thyromegaly present.  Cardiovascular: Normal rate, regular rhythm, normal heart sounds and intact distal pulses.   No murmur heard. Pulmonary/Chest: Effort normal and breath sounds normal. No respiratory distress. She has no wheezes.  Abdominal: Soft. Bowel sounds are normal. She exhibits no distension. There is no tenderness.  Musculoskeletal: Normal range of motion. She exhibits no edema or tenderness.  Neurological: She is alert and oriented to person, place, and time. She has normal reflexes. No cranial nerve deficit.  Skin: Skin is warm and dry.  Psychiatric: She has a normal mood and affect. Her behavior is normal. Judgment and thought content normal.  Vitals reviewed.    BP 142/94 mmHg  Pulse 89  Temp(Src) 98 F (36.7 C) (Oral)  Ht 5\' 1"  (1.549 m)  Wt 241 lb (109.317 kg)  BMI 45.56 kg/m2  LMP 03/01/2014 (Approximate)      Assessment & Plan:  1. Flu-like symptoms - Veritor Flu A/B Waived  2. Acute maxillary sinusitis, recurrence not specified -- Take meds as prescribed - Use a cool mist humidifier  -Use saline nose sprays frequently -Saline irrigations of the nose can be very helpful if done frequently.  * 4X daily for 1 week*  * Use of a nettie pot can be helpful with this. Follow directions with this* -Force fluids -For any cough or congestion  Use plain Mucinex- regular strength or max strength is fine   * Children- consult with Pharmacist for dosing -For fever or aces or pains- take tylenol or ibuprofen appropriate for age and weight.  * for fevers greater than 101 orally you may  alternate ibuprofen and tylenol every  3 hours. -Throat lozenges if help - amoxicillin-clavulanate (AUGMENTIN) 875-125 MG tablet; Take 1 tablet by mouth 2 (two) times daily.  Dispense: 14 tablet; Refill: 0  Jannifer Rodney, FNP

## 2015-09-30 NOTE — Patient Instructions (Signed)
Sinusitis, Adult Sinusitis is redness, soreness, and inflammation of the paranasal sinuses. Paranasal sinuses are air pockets within the bones of your face. They are located beneath your eyes, in the middle of your forehead, and above your eyes. In healthy paranasal sinuses, mucus is able to drain out, and air is able to circulate through them by way of your nose. However, when your paranasal sinuses are inflamed, mucus and air can become trapped. This can allow bacteria and other germs to grow and cause infection. Sinusitis can develop quickly and last only a short time (acute) or continue over a long period (chronic). Sinusitis that lasts for more than 12 weeks is considered chronic. CAUSES Causes of sinusitis include:  Allergies.  Structural abnormalities, such as displacement of the cartilage that separates your nostrils (deviated septum), which can decrease the air flow through your nose and sinuses and affect sinus drainage.  Functional abnormalities, such as when the small hairs (cilia) that line your sinuses and help remove mucus do not work properly or are not present. SIGNS AND SYMPTOMS Symptoms of acute and chronic sinusitis are the same. The primary symptoms are pain and pressure around the affected sinuses. Other symptoms include:  Upper toothache.  Earache.  Headache.  Bad breath.  Decreased sense of smell and taste.  A cough, which worsens when you are lying flat.  Fatigue.  Fever.  Thick drainage from your nose, which often is green and may contain pus (purulent).  Swelling and warmth over the affected sinuses. DIAGNOSIS Your health care provider will perform a physical exam. During your exam, your health care provider may perform any of the following to help determine if you have acute sinusitis or chronic sinusitis:  Look in your nose for signs of abnormal growths in your nostrils (nasal polyps).  Tap over the affected sinus to check for signs of  infection.  View the inside of your sinuses using an imaging device that has a light attached (endoscope). If your health care provider suspects that you have chronic sinusitis, one or more of the following tests may be recommended:  Allergy tests.  Nasal culture. A sample of mucus is taken from your nose, sent to a lab, and screened for bacteria.  Nasal cytology. A sample of mucus is taken from your nose and examined by your health care provider to determine if your sinusitis is related to an allergy. TREATMENT Most cases of acute sinusitis are related to a viral infection and will resolve on their own within 10 days. Sometimes, medicines are prescribed to help relieve symptoms of both acute and chronic sinusitis. These may include pain medicines, decongestants, nasal steroid sprays, or saline sprays. However, for sinusitis related to a bacterial infection, your health care provider will prescribe antibiotic medicines. These are medicines that will help kill the bacteria causing the infection. Rarely, sinusitis is caused by a fungal infection. In these cases, your health care provider will prescribe antifungal medicine. For some cases of chronic sinusitis, surgery is needed. Generally, these are cases in which sinusitis recurs more than 3 times per year, despite other treatments. HOME CARE INSTRUCTIONS  Drink plenty of water. Water helps thin the mucus so your sinuses can drain more easily.  Use a humidifier.  Inhale steam 3-4 times a day (for example, sit in the bathroom with the shower running).  Apply a warm, moist washcloth to your face 3-4 times a day, or as directed by your health care provider.  Use saline nasal sprays to help   moisten and clean your sinuses.  Take medicines only as directed by your health care provider.  If you were prescribed either an antibiotic or antifungal medicine, finish it all even if you start to feel better. SEEK IMMEDIATE MEDICAL CARE IF:  You have  increasing pain or severe headaches.  You have nausea, vomiting, or drowsiness.  You have swelling around your face.  You have vision problems.  You have a stiff neck.  You have difficulty breathing.   This information is not intended to replace advice given to you by your health care provider. Make sure you discuss any questions you have with your health care provider.   Document Released: 06/17/2005 Document Revised: 07/08/2014 Document Reviewed: 07/02/2011 Elsevier Interactive Patient Education 2016 [employer]  - Take meds as prescribed - Use a cool mist humidifier  -Use saline nose sprays frequently -Saline irrigations of the nose can be very helpful if done frequently.  * 4X daily for 1 week*  * Use of a nettie pot can be helpful with this. Follow directions with this* -Force fluids -For any cough or congestion  Use plain Mucinex- regular strength or max strength is fine   * Children- consult with Pharmacist for dosing -For fever or aces or pains- take tylenol or ibuprofen appropriate for age and weight.  * for fevers greater than 101 orally you may alternate ibuprofen and tylenol every  3 hours. -Throat lozenges if help   Jacari Iannello, FNP   

## 2015-10-01 LAB — PAP IG W/ RFLX HPV ASCU: PAP Smear Comment: 0

## 2015-10-02 ENCOUNTER — Other Ambulatory Visit: Payer: Self-pay | Admitting: Family

## 2015-10-02 DIAGNOSIS — D509 Iron deficiency anemia, unspecified: Secondary | ICD-10-CM | POA: Insufficient documentation

## 2015-10-02 MED ORDER — ATORVASTATIN CALCIUM 20 MG PO TABS: 20.0000 mg | ORAL_TABLET | Freq: Every day | ORAL | Status: DC

## 2015-10-02 NOTE — Progress Notes (Signed)
Patient aware.

## 2015-11-15 ENCOUNTER — Other Ambulatory Visit: Payer: Self-pay | Admitting: Family Medicine

## 2015-12-06 ENCOUNTER — Other Ambulatory Visit: Payer: Self-pay | Admitting: Family Medicine

## 2015-12-06 ENCOUNTER — Ambulatory Visit (INDEPENDENT_AMBULATORY_CARE_PROVIDER_SITE_OTHER): Payer: BLUE CROSS/BLUE SHIELD | Admitting: Family Medicine

## 2015-12-06 ENCOUNTER — Encounter: Payer: Self-pay | Admitting: Family Medicine

## 2015-12-06 VITALS — BP 119/57 | HR 85 | Temp 97.2°F | Ht 61.0 in | Wt 239.0 lb

## 2015-12-06 DIAGNOSIS — Z6841 Body Mass Index (BMI) 40.0 and over, adult: Secondary | ICD-10-CM | POA: Diagnosis not present

## 2015-12-06 DIAGNOSIS — J4520 Mild intermittent asthma, uncomplicated: Secondary | ICD-10-CM

## 2015-12-06 DIAGNOSIS — E785 Hyperlipidemia, unspecified: Secondary | ICD-10-CM | POA: Diagnosis not present

## 2015-12-06 DIAGNOSIS — I10 Essential (primary) hypertension: Secondary | ICD-10-CM

## 2015-12-06 DIAGNOSIS — D509 Iron deficiency anemia, unspecified: Secondary | ICD-10-CM | POA: Diagnosis not present

## 2015-12-06 MED ORDER — SIMVASTATIN 20 MG PO TABS: 20.0000 mg | ORAL_TABLET | Freq: Every day | ORAL | Status: DC

## 2015-12-06 NOTE — Progress Notes (Signed)
Subjective:  Patient ID: Tamara Macias, female    DOB: 1976-06-08  Age: 40 y.o. MRN: [id number]  CC: Hypertension   HPI Tamara Macias presents for  follow-up of hypertension. Patient has no history of headache chest pain or shortness of breath or recent cough. Patient also denies symptoms of TIA such as numbness weakness lateralizing. Patient checks  blood pressure at home and has not had any elevated readings recently. Patient denies side effects from medication. States taking it regularly.  Couldn't tolerate atorvastatin, caused bone aches withiin a  Week. However, she has multiple aches in knees (from weight) and shoulders (from work activities) at baseline. Ibuprofen helps. Also states she has  Tolerated simvastatin in past.  Iron deficiency questioned. Thinks it was because She was about to start her period. So she is only taking the iron intermittently.  Patient also states that she is not even sure she has asthma. She does have some dust exposure at work. Occasional gets short winded. Father and a nephew both have respiratory illness. She thinks her father's is probably because of his smoking. She is a nonsmoker/never smoker.   History  Patient Active Problem List   Diagnosis Date Noted  . Iron deficiency anemia 10/02/2015  . Morbid obesity with BMI of 45.0-49.9, adult ([facility]) 09/28/2015  . Asthma, chronic 11/09/2014  . Hypertension 10/28/2013  . Hyperlipidemia with target LDL less than 100 10/28/2013     Tamara Macias has a past medical history of Hyperlipidemia and Hypertension.   She has no past surgical history on file.   Her family history includes COPD in her father; Hypertension in her father.She reports that she has never smoked. She does not have any smokeless tobacco history on file. She reports that she does not drink alcohol or use illicit drugs.  Current Outpatient Prescriptions on File Prior to Visit  Medication Sig Dispense Refill  . ibuprofen  (ADVIL,MOTRIN) 600 MG tablet TAKE 1 TABLET (600 MG TOTAL)      BY MOUTH 2 (TWO) TIMES DAILY AS NEEDE D . 60 tablet 0  . lisinopril (PRINIVIL,ZESTRIL) 10 MG tablet Take 1 tablet (10 mg total) by mouth daily. 30 tablet 11  . PROVENTIL HFA 108 (90 Base) MCG/ACT inhaler INHALE ONE TO TWO PUFFS BY MOUTH EVERY 6 HOURS AS NEEDED FOR WHEEZING OR SHORTNESS OF BREATH 7 each 0   No current facility-administered medications on file prior to visit.    ROS Review of Systems  Constitutional: Negative for fever, activity change and appetite change.  HENT: Negative for congestion, rhinorrhea and sore throat.   Eyes: Negative for visual disturbance.  Respiratory: Negative for cough and shortness of breath.   Cardiovascular: Negative for chest pain and palpitations.  Gastrointestinal: Negative for nausea, abdominal pain and diarrhea.  Genitourinary: Negative for dysuria.  Musculoskeletal: Negative for myalgias and arthralgias.    Objective:  BP 119/57 mmHg  Pulse 85  Temp(Src) 97.2 F (36.2 C) (Oral)  Ht _0  (1.549 m)  Wt 239 lb (108.41 kg)  BMI 45.18 kg/m2  SpO2 99%  LMP 11/20/2015  BP Readings from Last 3 Encounters:  12/06/15 119/57  09/30/15 142/94  09/28/15 136/81    Wt Readings from Last 3 Encounters:  12/06/15 239 lb (108.41 kg)  09/30/15 241 lb (109.317 kg)  09/28/15 241 lb 3.2 oz (109.408 kg)     Physical Exam  Constitutional: She is oriented to person, place, and time. She appears well-developed and well-nourished. No distress.  HENT:  Head:  Normocephalic and atraumatic.  Right Ear: External ear normal.  Left Ear: External ear normal.  Nose: Nose normal.  Mouth/Throat: Oropharynx is clear and moist.  Eyes: Conjunctivae and EOM are normal. Pupils are equal, round, and reactive to light.  Neck: Normal range of motion. Neck supple. No thyromegaly present.  Cardiovascular: Normal rate, regular rhythm and normal heart sounds.   No murmur heard. Pulmonary/Chest: Effort  normal and breath sounds normal. No respiratory distress. She has no wheezes. She has no rales.  Abdominal: Soft. Bowel sounds are normal. She exhibits no distension. There is no tenderness.  Lymphadenopathy:    She has no cervical adenopathy.  Neurological: She is alert and oriented to person, place, and time. She has normal reflexes.  Skin: Skin is warm and dry.  Psychiatric: She has a normal mood and affect. Her behavior is normal. Judgment and thought content normal.     Lab Results  Component Value Date   WBC 14.8* 09/28/2015   HCT 42.0 09/28/2015   PLT 270 09/28/2015   GLUCOSE 87 09/28/2015   CHOL 219* 09/28/2015   TRIG 194* 09/28/2015   HDL 37* 09/28/2015   LDLCALC 143* 09/28/2015   ALT 17 09/28/2015   AST 15 09/28/2015   NA 140 09/28/2015   K 4.6 09/28/2015   CL 100 09/28/2015   CREATININE 0.76 09/28/2015   BUN 11 09/28/2015   CO2 22 09/28/2015   TSH 1.720 09/28/2015    No results found.  Assessment & Plan:   Kimanh was seen today for hypertension.  Diagnoses and all orders for this visit:  Hyperlipidemia with target LDL less than 100 -     CMP14+EGFR  Essential hypertension -     CMP14+EGFR  Asthma, chronic, mild intermittent, uncomplicated -     PR BREATHING CAPACITY TEST  Morbid obesity with BMI of 45.0-49.9, adult ([facility])  Iron deficiency anemia -     CBC with Differential/Platelet  Other orders -     simvastatin (ZOCOR) 20 MG tablet; Take 1 tablet (20 mg total) by mouth at bedtime.   I have discontinued Tamara Macias (ROBITUSSIN DM PO), amoxicillin-clavulanate, and atorvastatin. I am also having her start on simvastatin. Additionally, I am having her maintain her lisinopril, ibuprofen, PROVENTIL HFA, and fluticasone.  Meds ordered this encounter  Medications  . fluticasone (FLONASE) 50 MCG/ACT nasal spray    Sig:     Refill:  0  . simvastatin (ZOCOR) 20 MG tablet    Sig: Take 1 tablet (20 mg total) by mouth at  bedtime.    Dispense:  90 tablet    Refill:  3    Follow-up: Return in about 6 months (around 06/06/2016).  Claretta Fraise, M.D.

## 2015-12-07 ENCOUNTER — Other Ambulatory Visit: Payer: Self-pay | Admitting: Family Medicine

## 2015-12-07 LAB — CMP14+EGFR
A/G RATIO: 1.5 (ref 1.2–2.2)
ALT: 38 IU/L — AB (ref 0–32)
AST: 24 IU/L (ref 0–40)
Albumin: 4.2 g/dL (ref 3.5–5.5)
Alkaline Phosphatase: 96 IU/L (ref 39–117)
BUN/Creatinine Ratio: 22 (ref 9–23)
BUN: 12 mg/dL (ref 6–20)
Bilirubin Total: 0.3 mg/dL (ref 0.0–1.2)
CALCIUM: 9.8 mg/dL (ref 8.7–10.2)
CHLORIDE: 98 mmol/L (ref 96–106)
CO2: 21 mmol/L (ref 18–29)
Creatinine, Ser: 0.55 mg/dL — ABNORMAL LOW (ref 0.57–1.00)
GFR calc Af Amer: 139 mL/min/{1.73_m2} (ref >59)
GFR, EST NON AFRICAN AMERICAN: 120 mL/min/{1.73_m2} (ref >59)
Globulin, Total: 2.8 g/dL (ref 1.5–4.5)
Glucose: 86 mg/dL (ref 65–99)
POTASSIUM: 4.7 mmol/L (ref 3.5–5.2)
Sodium: 137 mmol/L (ref 134–144)
Total Protein: 7 g/dL (ref 6.0–8.5)

## 2015-12-07 LAB — CBC WITH DIFFERENTIAL/PLATELET
BASOS: 0 %
Basophils Absolute: 0 10*3/uL (ref 0.0–0.2)
EOS (ABSOLUTE): 0.3 10*3/uL (ref 0.0–0.4)
EOS: 3 %
HEMATOCRIT: 40.6 % (ref 34.0–46.6)
HEMOGLOBIN: 13.1 g/dL (ref 11.1–15.9)
IMMATURE GRANS (ABS): 0 10*3/uL (ref 0.0–0.1)
Immature Granulocytes: 0 %
LYMPHS ABS: 2.5 10*3/uL (ref 0.7–3.1)
LYMPHS: 27 %
MCH: 26.1 pg — ABNORMAL LOW (ref 26.6–33.0)
MCHC: 32.3 g/dL (ref 31.5–35.7)
MCV: 81 fL (ref 79–97)
MONOCYTES: 8 %
Monocytes Absolute: 0.7 10*3/uL (ref 0.1–0.9)
NEUTROS ABS: 5.6 10*3/uL (ref 1.4–7.0)
Neutrophils: 62 %
Platelets: 312 10*3/uL (ref 150–379)
RBC: 5.01 x10E6/uL (ref 3.77–5.28)
RDW: 16.1 % — ABNORMAL HIGH (ref 12.3–15.4)
WBC: 9.1 10*3/uL (ref 3.4–10.8)

## 2015-12-07 MED ORDER — ALBUTEROL SULFATE HFA 108 (90 BASE) MCG/ACT IN AERS: INHALATION_SPRAY | RESPIRATORY_TRACT | Status: DC

## 2015-12-07 MED ORDER — IBUPROFEN 600 MG PO TABS: ORAL_TABLET | ORAL | Status: DC

## 2015-12-07 NOTE — Telephone Encounter (Signed)
done

## 2015-12-19 ENCOUNTER — Telehealth: Payer: Self-pay | Admitting: Family Medicine

## 2015-12-19 NOTE — Telephone Encounter (Signed)
Pt aware of lab results. She was not able to sign into MyChart

## 2016-05-03 DIAGNOSIS — J40 Bronchitis, not specified as acute or chronic: Secondary | ICD-10-CM | POA: Diagnosis not present

## 2016-06-30 ENCOUNTER — Other Ambulatory Visit: Payer: Self-pay | Admitting: Family Medicine

## 2016-07-02 DIAGNOSIS — J40 Bronchitis, not specified as acute or chronic: Secondary | ICD-10-CM | POA: Diagnosis not present

## 2016-07-02 NOTE — Telephone Encounter (Signed)
Authorize 30 days only. Then contact the patient letting them know that they will need an appointment before any further prescriptions can be sent in. 

## 2016-08-09 ENCOUNTER — Encounter: Payer: Self-pay | Admitting: Family Medicine

## 2016-08-09 ENCOUNTER — Ambulatory Visit (INDEPENDENT_AMBULATORY_CARE_PROVIDER_SITE_OTHER): Payer: BLUE CROSS/BLUE SHIELD | Admitting: Family Medicine

## 2016-08-09 VITALS — BP 121/79 | HR 78 | Temp 97.7°F | Ht 61.0 in | Wt 240.0 lb

## 2016-08-09 DIAGNOSIS — Z6841 Body Mass Index (BMI) 40.0 and over, adult: Secondary | ICD-10-CM

## 2016-08-09 DIAGNOSIS — D509 Iron deficiency anemia, unspecified: Secondary | ICD-10-CM

## 2016-08-09 DIAGNOSIS — I10 Essential (primary) hypertension: Secondary | ICD-10-CM

## 2016-08-09 DIAGNOSIS — E785 Hyperlipidemia, unspecified: Secondary | ICD-10-CM | POA: Diagnosis not present

## 2016-08-09 MED ORDER — ALBUTEROL SULFATE HFA 108 (90 BASE) MCG/ACT IN AERS: INHALATION_SPRAY | RESPIRATORY_TRACT | 2 refills | Status: DC

## 2016-08-09 MED ORDER — LISINOPRIL 10 MG PO TABS: 10.0000 mg | ORAL_TABLET | Freq: Every day | ORAL | 5 refills | Status: DC

## 2016-08-09 MED ORDER — ROSUVASTATIN CALCIUM 5 MG PO TABS: 5.0000 mg | ORAL_TABLET | Freq: Every day | ORAL | 5 refills | Status: DC

## 2016-08-09 NOTE — Progress Notes (Signed)
Subjective:  Patient ID: Tamara Macias, female    DOB: [birth year]  Age: 41 y.o. MRN: [id number]  CC: Hypertension (pt here today for a follow up on her HTN)   HPI Tamara Macias presents for  follow-up of hypertension. Patient has no history of headache chest pain or shortness of breath or recent cough. Patient also denies symptoms of TIA such as numbness weakness lateralizing. Patient checks  blood pressure at home and has not had any elevated readings recently. Patient denies side effects from his medication. States taking it regularly.  Patient also  in for follow-up of elevated cholesterol. Doing well without complaints on current medication.Zocor caused the same myalgia and arthralgia as atorvastatin when she took it regularly. However it did not cause nausea. Also in today for liver function testing. Currently no chest pain, shortness of breath or other cardiovascular related symptoms noted.   Asthma has been quite this winter. She occasionally uses her albuterol but does not have any significant shortness of breath. Her joint pains are much better as well. She is not taking the ibuprofen currently she did get some refills on it last month and took it for a little while but currently she is not.  History Tamara Macias has a past medical history of Hyperlipidemia and Hypertension.   She has no past surgical history on file.   Her family history includes COPD in her father; Hypertension in her father.She reports that she has never smoked. She does not have any smokeless tobacco history on file. She reports that she does not drink alcohol or use drugs.  Current Outpatient Prescriptions on File Prior to Visit  Medication Sig Dispense Refill  . albuterol (PROVENTIL HFA) 108 (90 Base) MCG/ACT inhaler INHALE ONE TO TWO PUFFS PO EVERY 6 HOURS PRN FOR WHEEZING 7 each 2  . fluticasone (FLONASE) 50 MCG/ACT nasal spray   0  . ibuprofen (ADVIL,MOTRIN) 600 MG tablet TAKE ONE TABLET BY MOUTH TWICE  DAILY AS NEEDED 60 tablet 1  . lisinopril (PRINIVIL,ZESTRIL) 10 MG tablet TAKE ONE TABLET BY MOUTH ONCE DAILY 30 tablet 5   No current facility-administered medications on file prior to visit.     ROS Review of Systems  Constitutional: Negative for activity change, appetite change and fever.  HENT: Negative for congestion, rhinorrhea and sore throat.   Eyes: Negative for visual disturbance.  Respiratory: Negative for cough and shortness of breath.   Cardiovascular: Negative for chest pain and palpitations.  Gastrointestinal: Negative for abdominal pain, diarrhea and nausea.  Genitourinary: Negative for dysuria.  Musculoskeletal: Negative for arthralgias and myalgias.    Objective:  BP 121/79   Pulse 78   Temp 97.7 F (36.5 C) (Oral)   Ht 5' 1"  (1.549 m)   Wt 240 lb (108.9 kg)   BMI 45.35 kg/m   BP Readings from Last 3 Encounters:  08/09/16 121/79  12/06/15 (!) 119/57  09/30/15 (!) 142/94    Wt Readings from Last 3 Encounters:  08/09/16 240 lb (108.9 kg)  12/06/15 239 lb (108.4 kg)  09/30/15 241 lb (109.3 kg)     Physical Exam  Constitutional: She is oriented to person, place, and time. She appears well-developed and well-nourished. No distress.  HENT:  Head: Normocephalic and atraumatic.  Right Ear: External ear normal.  Left Ear: External ear normal.  Nose: Nose normal.  Mouth/Throat: Oropharynx is clear and moist.  Eyes: Conjunctivae and EOM are normal. Pupils are equal, round, and reactive to light.  Neck: Normal  range of motion. Neck supple. No thyromegaly present.  Cardiovascular: Normal rate, regular rhythm and normal heart sounds.   No murmur heard. Pulmonary/Chest: Effort normal and breath sounds normal. No respiratory distress. She has no wheezes. She has no rales.  Abdominal: Soft. Bowel sounds are normal. She exhibits no distension. There is no tenderness.  Musculoskeletal: Normal range of motion. She exhibits no edema, tenderness or deformity.    Lymphadenopathy:    She has no cervical adenopathy.  Neurological: She is alert and oriented to person, place, and time. She has normal reflexes.  Skin: Skin is warm and dry.  Psychiatric: She has a normal mood and affect. Her behavior is normal. Judgment and thought content normal.     Lab Results  Component Value Date   WBC 9.1 12/06/2015   HCT 40.6 12/06/2015   PLT 312 12/06/2015   GLUCOSE 86 12/06/2015   CHOL 219 (H) 09/28/2015   TRIG 194 (H) 09/28/2015   HDL 37 (L) 09/28/2015   LDLCALC 143 (H) 09/28/2015   ALT 38 (H) 12/06/2015   AST 24 12/06/2015   NA 137 12/06/2015   K 4.7 12/06/2015   CL 98 12/06/2015   CREATININE 0.55 (L) 12/06/2015   BUN 12 12/06/2015   CO2 21 12/06/2015   TSH 1.720 09/28/2015       Assessment & Plan:   Tamara Macias was seen today for hypertension.  Diagnoses and all orders for this visit:  Essential hypertension -     CBC with Differential/Platelet -     CMP14+EGFR  Hyperlipidemia with target LDL less than 100 -     CBC with Differential/Platelet -     CMP14+EGFR -     Lipid panel  Morbid obesity with BMI of 45.0-49.9, adult ([facility])  Iron deficiency anemia, unspecified iron deficiency anemia type -     CBC with Differential/Platelet -     CMP14+EGFR   I have discontinued Tamara Macias's simvastatin. I am also having her maintain her fluticasone, lisinopril, albuterol, and ibuprofen.  No orders of the defined types were placed in this encounter.    Follow-up: No Follow-up on file.  Tamara Macias, M.D.

## 2016-08-10 LAB — CBC WITH DIFFERENTIAL/PLATELET
BASOS ABS: 0.1 10*3/uL (ref 0.0–0.2)
Basos: 1 %
EOS (ABSOLUTE): 0.2 10*3/uL (ref 0.0–0.4)
EOS: 2 %
HEMATOCRIT: 40.3 % (ref 34.0–46.6)
Hemoglobin: 13 g/dL (ref 11.1–15.9)
IMMATURE GRANULOCYTES: 0 %
Immature Grans (Abs): 0 10*3/uL (ref 0.0–0.1)
LYMPHS ABS: 2.3 10*3/uL (ref 0.7–3.1)
Lymphs: 22 %
MCH: 26.4 pg — AB (ref 26.6–33.0)
MCHC: 32.3 g/dL (ref 31.5–35.7)
MCV: 82 fL (ref 79–97)
Monocytes Absolute: 1 10*3/uL — ABNORMAL HIGH (ref 0.1–0.9)
Monocytes: 9 %
NEUTROS PCT: 66 %
Neutrophils Absolute: 7.2 10*3/uL — ABNORMAL HIGH (ref 1.4–7.0)
PLATELETS: 261 10*3/uL (ref 150–379)
RBC: 4.92 x10E6/uL (ref 3.77–5.28)
RDW: 15.6 % — AB (ref 12.3–15.4)
WBC: 10.7 10*3/uL (ref 3.4–10.8)

## 2016-08-10 LAB — CMP14+EGFR
A/G RATIO: 1.6 (ref 1.2–2.2)
ALT: 20 IU/L (ref 0–32)
AST: 12 IU/L (ref 0–40)
Albumin: 3.9 g/dL (ref 3.5–5.5)
Alkaline Phosphatase: 99 IU/L (ref 39–117)
BILIRUBIN TOTAL: 0.3 mg/dL (ref 0.0–1.2)
BUN/Creatinine Ratio: 26 — ABNORMAL HIGH (ref 9–23)
BUN: 14 mg/dL (ref 6–20)
CHLORIDE: 100 mmol/L (ref 96–106)
CO2: 22 mmol/L (ref 18–29)
Calcium: 9.2 mg/dL (ref 8.7–10.2)
Creatinine, Ser: 0.53 mg/dL — ABNORMAL LOW (ref 0.57–1.00)
GFR calc Af Amer: 139 mL/min/{1.73_m2} (ref >59)
GFR, EST NON AFRICAN AMERICAN: 121 mL/min/{1.73_m2} (ref >59)
GLOBULIN, TOTAL: 2.4 g/dL (ref 1.5–4.5)
Glucose: 90 mg/dL (ref 65–99)
POTASSIUM: 4.8 mmol/L (ref 3.5–5.2)
SODIUM: 136 mmol/L (ref 134–144)
Total Protein: 6.3 g/dL (ref 6.0–8.5)

## 2016-08-10 LAB — LIPID PANEL
Chol/HDL Ratio: 5.3 ratio units — ABNORMAL HIGH (ref 0.0–4.4)
Cholesterol, Total: 230 mg/dL — ABNORMAL HIGH (ref 100–199)
HDL: 43 mg/dL (ref >39)
LDL Calculated: 162 mg/dL — ABNORMAL HIGH (ref 0–99)
TRIGLYCERIDES: 126 mg/dL (ref 0–149)
VLDL Cholesterol Cal: 25 mg/dL (ref 5–40)

## 2016-08-13 ENCOUNTER — Telehealth: Payer: Self-pay | Admitting: Family Medicine

## 2016-08-13 NOTE — Telephone Encounter (Signed)
Aware of lab results  

## 2016-08-31 DIAGNOSIS — H60332 Swimmer's ear, left ear: Secondary | ICD-10-CM | POA: Diagnosis not present

## 2016-08-31 DIAGNOSIS — H9202 Otalgia, left ear: Secondary | ICD-10-CM | POA: Diagnosis not present

## 2016-11-30 ENCOUNTER — Other Ambulatory Visit: Payer: Self-pay | Admitting: Family Medicine

## 2016-12-13 DIAGNOSIS — H6692 Otitis media, unspecified, left ear: Secondary | ICD-10-CM | POA: Diagnosis not present

## 2016-12-13 DIAGNOSIS — H6592 Unspecified nonsuppurative otitis media, left ear: Secondary | ICD-10-CM | POA: Diagnosis not present

## 2016-12-13 DIAGNOSIS — J209 Acute bronchitis, unspecified: Secondary | ICD-10-CM | POA: Diagnosis not present

## 2016-12-13 DIAGNOSIS — J019 Acute sinusitis, unspecified: Secondary | ICD-10-CM | POA: Diagnosis not present

## 2016-12-27 DIAGNOSIS — J029 Acute pharyngitis, unspecified: Secondary | ICD-10-CM | POA: Diagnosis not present

## 2016-12-27 DIAGNOSIS — R05 Cough: Secondary | ICD-10-CM | POA: Diagnosis not present

## 2016-12-27 DIAGNOSIS — J019 Acute sinusitis, unspecified: Secondary | ICD-10-CM | POA: Diagnosis not present

## 2016-12-27 DIAGNOSIS — H6692 Otitis media, unspecified, left ear: Secondary | ICD-10-CM | POA: Diagnosis not present

## 2017-02-18 ENCOUNTER — Other Ambulatory Visit: Payer: Self-pay | Admitting: Family Medicine

## 2017-03-18 ENCOUNTER — Other Ambulatory Visit: Payer: Self-pay | Admitting: Family Medicine

## 2017-03-19 ENCOUNTER — Telehealth: Payer: Self-pay | Admitting: Family Medicine

## 2017-03-19 NOTE — Telephone Encounter (Signed)
30 day supply sent to pharmacy. Patient notified

## 2017-03-21 ENCOUNTER — Encounter: Payer: Self-pay | Admitting: Family Medicine

## 2017-03-21 ENCOUNTER — Ambulatory Visit (INDEPENDENT_AMBULATORY_CARE_PROVIDER_SITE_OTHER): Payer: BLUE CROSS/BLUE SHIELD | Admitting: Family Medicine

## 2017-03-21 VITALS — BP 130/80 | HR 74 | Temp 97.9°F | Ht 61.0 in | Wt 232.0 lb

## 2017-03-21 DIAGNOSIS — H66002 Acute suppurative otitis media without spontaneous rupture of ear drum, left ear: Secondary | ICD-10-CM | POA: Diagnosis not present

## 2017-03-21 DIAGNOSIS — I10 Essential (primary) hypertension: Secondary | ICD-10-CM

## 2017-03-21 DIAGNOSIS — E785 Hyperlipidemia, unspecified: Secondary | ICD-10-CM | POA: Diagnosis not present

## 2017-03-21 MED ORDER — ALBUTEROL SULFATE HFA 108 (90 BASE) MCG/ACT IN AERS: INHALATION_SPRAY | RESPIRATORY_TRACT | 2 refills | Status: DC

## 2017-03-21 MED ORDER — LISINOPRIL 10 MG PO TABS: 10.0000 mg | ORAL_TABLET | Freq: Every day | ORAL | 0 refills | Status: DC

## 2017-03-21 MED ORDER — AMOXICILLIN-POT CLAVULANATE 875-125 MG PO TABS: 1.0000 | ORAL_TABLET | Freq: Two times a day (BID) | ORAL | 0 refills | Status: DC

## 2017-03-21 NOTE — Progress Notes (Signed)
Subjective:  Patient ID: Tamara Macias, female    DOB: [birth year]  Age: 41 y.o. MRN: [id number]  CC: Hypertension (pt here today for routine follow up of her chronic medical conditions and also c/o left ear pain)   HPI Tamara Macias presents for  follow-up of hypertension. Patient has no history of headache chest pain or shortness of breath or recent cough. Patient also denies symptoms of TIA such as numbness weakness lateralizing. Patient checks  blood pressure at home and has not had any elevated readings recently. Patient denies side effects from his medication. States taking it regularly. Patient in for follow-up of elevated cholesterol. Decided not to take cholesterol med. She isn't sure why. It wasn't causing side effects. Currently no chest pain, shortness of breath or other cardiovascular related symptoms noted.  Depression screen [facility] 2/9 03/21/2017 08/09/2016 12/06/2015  Decreased Interest 0 0 0  Down, Depressed, Hopeless 0 0 0  PHQ - 2 Score 0 0 0    History Tamara Macias has a past medical history of Hyperlipidemia and Hypertension.   She has no past surgical history on file.   Her family history includes COPD in her father; Hypertension in her father.She reports that she has never smoked. She has never used smokeless tobacco. She reports that she does not drink alcohol or use drugs.    ROS Review of Systems  Constitutional: Negative for activity change, appetite change and fever.  HENT: Negative for congestion, rhinorrhea and sore throat.   Eyes: Negative for visual disturbance.  Respiratory: Negative for cough and shortness of breath.   Cardiovascular: Negative for chest pain and palpitations.  Gastrointestinal: Negative for abdominal pain, diarrhea and nausea.  Genitourinary: Negative for dysuria.  Musculoskeletal: Negative for arthralgias and myalgias.    Objective:  BP 130/80   Pulse 74   Temp 97.9 F (36.6 C) (Oral)   Ht 5' 1"  (1.549 m)   Wt 232 lb (105.2  kg)   BMI 43.84 kg/m   BP Readings from Last 3 Encounters:  03/21/17 130/80  08/09/16 121/79  12/06/15 (!) 119/57    Wt Readings from Last 3 Encounters:  03/21/17 232 lb (105.2 kg)  08/09/16 240 lb (108.9 kg)  12/06/15 239 lb (108.4 kg)     Physical Exam  Constitutional: She is oriented to person, place, and time. She appears well-developed and well-nourished. No distress.  HENT:  Head: Normocephalic and atraumatic.  Right Ear: Hearing, tympanic membrane, external ear and ear canal normal.  Left Ear: Hearing, external ear and ear canal normal. Tympanic membrane is erythematous. A middle ear effusion is present.  Eyes: Pupils are equal, round, and reactive to light. Conjunctivae are normal.  Neck: Normal range of motion. Neck supple. No thyromegaly present.  Cardiovascular: Normal rate, regular rhythm and normal heart sounds.   No murmur heard. Pulmonary/Chest: Effort normal and breath sounds normal. No respiratory distress. She has no wheezes. She has no rales.  Abdominal: Soft. Bowel sounds are normal. She exhibits no distension. There is no tenderness.  Musculoskeletal: Normal range of motion.  Lymphadenopathy:    She has no cervical adenopathy.  Neurological: She is alert and oriented to person, place, and time.  Skin: Skin is warm and dry.  Psychiatric: She has a normal mood and affect. Her behavior is normal. Judgment and thought content normal.      Assessment & Plan:   Tamara Macias was seen today for hypertension.  Diagnoses and all orders for this visit:  Essential hypertension -  CBC with Differential/Platelet  Hyperlipidemia with target LDL less than 100 -     CMP14+EGFR -     Lipid panel  Acute suppurative otitis media of left ear without spontaneous rupture of tympanic membrane, recurrence not specified  Other orders -     albuterol (PROVENTIL HFA) 108 (90 Base) MCG/ACT inhaler; INHALE ONE TO TWO PUFFS PO EVERY 6 HOURS PRN FOR WHEEZING -     lisinopril  (PRINIVIL,ZESTRIL) 10 MG tablet; Take 1 tablet (10 mg total) by mouth daily. -     amoxicillin-clavulanate (AUGMENTIN) 875-125 MG tablet; Take 1 tablet by mouth 2 (two) times daily.       I have discontinued Tamara Macias's rosuvastatin. I have also changed her lisinopril. Additionally, I am having her start on amoxicillin-clavulanate. Lastly, I am having her maintain her fluticasone, ibuprofen, and albuterol.  Allergies as of 03/21/2017      Reactions   Keflex [cephalexin] Nausea Only   Sulfa Antibiotics Hives      Medication List       Accurate as of 03/21/17  4:50 PM. Always use your most recent med list.          albuterol 108 (90 Base) MCG/ACT inhaler Commonly known as:  PROVENTIL HFA INHALE ONE TO TWO PUFFS PO EVERY 6 HOURS PRN FOR WHEEZING   amoxicillin-clavulanate 875-125 MG tablet Commonly known as:  AUGMENTIN Take 1 tablet by mouth 2 (two) times daily.   fluticasone 50 MCG/ACT nasal spray Commonly known as:  FLONASE   ibuprofen 600 MG tablet Commonly known as:  ADVIL,MOTRIN TAKE 1 TABLET BY MOUTH TWICE DAILY AS NEEDED   lisinopril 10 MG tablet Commonly known as:  PRINIVIL,ZESTRIL Take 1 tablet (10 mg total) by mouth daily.            Discharge Care Instructions        Start     Ordered   03/21/17 0000  CBC with Differential/Platelet     03/21/17 1639   03/21/17 0000  CMP14+EGFR     03/21/17 1639   03/21/17 0000  Lipid panel     03/21/17 1639   03/21/17 0000  albuterol (PROVENTIL HFA) 108 (90 Base) MCG/ACT inhaler     03/21/17 1643   03/21/17 0000  lisinopril (PRINIVIL,ZESTRIL) 10 MG tablet  Daily    Comments:  Please consider 90 day supplies to promote better adherence   03/21/17 1643   03/21/17 0000  amoxicillin-clavulanate (AUGMENTIN) 875-125 MG tablet  2 times daily     03/21/17 1644       Follow-up: Return in about 6 months (around 09/18/2017).  Claretta Fraise, M.D.

## 2017-05-02 DIAGNOSIS — R05 Cough: Secondary | ICD-10-CM | POA: Diagnosis not present

## 2017-05-02 DIAGNOSIS — J40 Bronchitis, not specified as acute or chronic: Secondary | ICD-10-CM | POA: Diagnosis not present

## 2017-05-02 DIAGNOSIS — Z6841 Body Mass Index (BMI) 40.0 and over, adult: Secondary | ICD-10-CM | POA: Diagnosis not present

## 2017-06-04 ENCOUNTER — Telehealth: Payer: Self-pay | Admitting: Family Medicine

## 2017-06-04 ENCOUNTER — Other Ambulatory Visit: Payer: Self-pay | Admitting: Family Medicine

## 2017-06-04 MED ORDER — ALBUTEROL SULFATE HFA 108 (90 BASE) MCG/ACT IN AERS: INHALATION_SPRAY | RESPIRATORY_TRACT | 2 refills | Status: DC

## 2017-06-04 MED ORDER — LISINOPRIL 10 MG PO TABS: 10.0000 mg | ORAL_TABLET | Freq: Every day | ORAL | 1 refills | Status: DC

## 2017-06-04 NOTE — Telephone Encounter (Signed)
What is the name of the medication? Lisinopril and Ibuprofen   Have you contacted your pharmacy to request a refill? Yes   Which pharmacy would you like this sent to? [facility] [place]    Patient notified that their request is being sent to the clinical staff for review and that they should receive a call once it is complete. If they do not receive a call within 24 hours they can check with their pharmacy or our office.

## 2017-06-04 NOTE — Telephone Encounter (Signed)
Rx sent to pharmacy   

## 2017-06-04 NOTE — Telephone Encounter (Signed)
Last seen 03/21/17. Please advise and send if approved.

## 2017-06-04 NOTE — Telephone Encounter (Signed)
Okay to refill all meds for 6 mos 

## 2017-08-27 DIAGNOSIS — Z6841 Body Mass Index (BMI) 40.0 and over, adult: Secondary | ICD-10-CM | POA: Diagnosis not present

## 2017-08-27 DIAGNOSIS — J40 Bronchitis, not specified as acute or chronic: Secondary | ICD-10-CM | POA: Diagnosis not present

## 2018-03-20 ENCOUNTER — Ambulatory Visit: Payer: BLUE CROSS/BLUE SHIELD | Admitting: Family Medicine

## 2018-03-20 ENCOUNTER — Encounter: Payer: Self-pay | Admitting: Family Medicine

## 2018-03-20 VITALS — BP 122/72 | HR 71 | Temp 98.2°F | Ht 61.0 in | Wt 238.1 lb

## 2018-03-20 DIAGNOSIS — E785 Hyperlipidemia, unspecified: Secondary | ICD-10-CM | POA: Diagnosis not present

## 2018-03-20 DIAGNOSIS — I10 Essential (primary) hypertension: Secondary | ICD-10-CM

## 2018-03-20 LAB — CBC WITH DIFFERENTIAL/PLATELET
BASOS: 1 %
Basophils Absolute: 0.1 10*3/uL (ref 0.0–0.2)
EOS (ABSOLUTE): 0.2 10*3/uL (ref 0.0–0.4)
Eos: 2 %
Hematocrit: 40.5 % (ref 34.0–46.6)
Hemoglobin: 13.5 g/dL (ref 11.1–15.9)
IMMATURE GRANS (ABS): 0.1 10*3/uL (ref 0.0–0.1)
IMMATURE GRANULOCYTES: 1 %
LYMPHS: 25 %
Lymphocytes Absolute: 2.4 10*3/uL (ref 0.7–3.1)
MCH: 26.8 pg (ref 26.6–33.0)
MCHC: 33.3 g/dL (ref 31.5–35.7)
MCV: 80 fL (ref 79–97)
Monocytes Absolute: 0.7 10*3/uL (ref 0.1–0.9)
Monocytes: 8 %
NEUTROS PCT: 63 %
Neutrophils Absolute: 6 10*3/uL (ref 1.4–7.0)
PLATELETS: 272 10*3/uL (ref 150–450)
RBC: 5.04 x10E6/uL (ref 3.77–5.28)
RDW: 14.5 % (ref 12.3–15.4)
WBC: 9.4 10*3/uL (ref 3.4–10.8)

## 2018-03-20 LAB — URINALYSIS
BILIRUBIN UA: NEGATIVE
Glucose, UA: NEGATIVE
KETONES UA: NEGATIVE
Leukocytes, UA: NEGATIVE
Nitrite, UA: NEGATIVE
PROTEIN UA: NEGATIVE
RBC, UA: NEGATIVE
SPEC GRAV UA: 1.02 (ref 1.005–1.030)
Urobilinogen, Ur: 0.2 mg/dL (ref 0.2–1.0)
pH, UA: 7.5 (ref 5.0–7.5)

## 2018-03-20 LAB — CMP14+EGFR
A/G RATIO: 1.8 (ref 1.2–2.2)
ALK PHOS: 93 IU/L (ref 39–117)
ALT: 26 IU/L (ref 0–32)
AST: 18 IU/L (ref 0–40)
Albumin: 4.2 g/dL (ref 3.5–5.5)
BUN / CREAT RATIO: 19 (ref 9–23)
BUN: 10 mg/dL (ref 6–24)
Bilirubin Total: 0.3 mg/dL (ref 0.0–1.2)
CHLORIDE: 102 mmol/L (ref 96–106)
CO2: 21 mmol/L (ref 20–29)
Calcium: 9.4 mg/dL (ref 8.7–10.2)
Creatinine, Ser: 0.54 mg/dL — ABNORMAL LOW (ref 0.57–1.00)
GFR calc Af Amer: 137 mL/min/{1.73_m2} (ref >59)
GFR calc non Af Amer: 118 mL/min/{1.73_m2} (ref >59)
GLUCOSE: 86 mg/dL (ref 65–99)
Globulin, Total: 2.4 g/dL (ref 1.5–4.5)
POTASSIUM: 4.9 mmol/L (ref 3.5–5.2)
SODIUM: 138 mmol/L (ref 134–144)
TOTAL PROTEIN: 6.6 g/dL (ref 6.0–8.5)

## 2018-03-20 LAB — LIPID PANEL
CHOLESTEROL TOTAL: 235 mg/dL — AB (ref 100–199)
Chol/HDL Ratio: 5.9 ratio — ABNORMAL HIGH (ref 0.0–4.4)
HDL: 40 mg/dL (ref >39)
LDL Calculated: 162 mg/dL — ABNORMAL HIGH (ref 0–99)
Triglycerides: 167 mg/dL — ABNORMAL HIGH (ref 0–149)
VLDL Cholesterol Cal: 33 mg/dL (ref 5–40)

## 2018-03-20 MED ORDER — LISINOPRIL 10 MG PO TABS: 10.0000 mg | ORAL_TABLET | Freq: Every day | ORAL | 1 refills | Status: DC

## 2018-04-15 ENCOUNTER — Other Ambulatory Visit: Payer: Self-pay | Admitting: *Deleted

## 2018-04-15 MED ORDER — ATORVASTATIN CALCIUM 40 MG PO TABS: 40.0000 mg | ORAL_TABLET | Freq: Every day | ORAL | 1 refills | Status: DC

## 2018-05-13 ENCOUNTER — Telehealth: Payer: Self-pay | Admitting: Family Medicine

## 2018-09-22 ENCOUNTER — Other Ambulatory Visit: Payer: Self-pay

## 2018-09-22 ENCOUNTER — Telehealth (INDEPENDENT_AMBULATORY_CARE_PROVIDER_SITE_OTHER): Payer: BLUE CROSS/BLUE SHIELD | Admitting: Family Medicine

## 2018-09-22 ENCOUNTER — Other Ambulatory Visit: Payer: Self-pay | Admitting: Family Medicine

## 2018-09-22 DIAGNOSIS — J4520 Mild intermittent asthma, uncomplicated: Secondary | ICD-10-CM | POA: Diagnosis not present

## 2018-09-22 MED ORDER — ALBUTEROL SULFATE HFA 108 (90 BASE) MCG/ACT IN AERS: INHALATION_SPRAY | RESPIRATORY_TRACT | 5 refills | Status: DC

## 2018-09-22 NOTE — Progress Notes (Signed)
   Virtual Visit via telephone Note  I connected with Aundria Rud on 09/22/18 at 4:29 by telephone and verified that I am speaking with the correct person using two identifiers. Makhala Parkman is currently located at work. The provider, Mechele Claude, MD is located in his office at time of visit.  I discussed the limitations, risks, security and privacy concerns of performing an evaluation and management service by telephone and the availability of in person appointments. I also discussed with the patient that there may be a patient responsible charge related to this service. The patient expressed understanding and agreed to proceed.  History of Present Illness Onset last night with one side of head stopped up.  Has asthma and a little yellow sputum sometimes with a cough. No refills available of her inhaler.  Denies dyspnea, but wheezing last night chest was tight.  Not tight now, but works in [employer]. Wears mask sometimes. No fever.    Assessment and Plan: 1. Mild intermittent chronic asthma without complication     Meds ordered this encounter  Medications  . albuterol (PROVENTIL HFA) 108 (90 Base) MCG/ACT inhaler    Sig: INHALE ONE TO TWO PUFFS PO EVERY 6 HOURS PRN FOR WHEEZING    Dispense:  1 each    Refill:  5     Follow Up Instructions:     I discussed the assessment and treatment plan with the patient. The patient was provided an opportunity to ask questions and all were answered. The patient agreed with the plan and demonstrated an understanding of the instructions.   The patient was advised to call back or seek an in-person evaluation if the symptoms worsen or if the condition fails to improve as anticipated.  The above assessment and management plan was discussed with the patient. The patient verbalized understanding of and has agreed to the management plan. Patient is aware to call the clinic if symptoms persist or worsen. Patient is aware when to  return to the clinic for a follow-up visit. Patient educated on when it is appropriate to go to the emergency department.    I provided 7 minutes of non-face-to-face time during this encounter. Visit ended at 4:36.    Mechele Claude, MD

## 2018-09-25 ENCOUNTER — Telehealth: Payer: BLUE CROSS/BLUE SHIELD | Admitting: Family Medicine

## 2018-10-17 ENCOUNTER — Other Ambulatory Visit: Payer: Self-pay | Admitting: Family Medicine

## 2018-10-22 ENCOUNTER — Other Ambulatory Visit: Payer: Self-pay | Admitting: Family Medicine

## 2019-03-13 ENCOUNTER — Other Ambulatory Visit: Payer: Self-pay | Admitting: Family Medicine

## 2019-03-16 ENCOUNTER — Other Ambulatory Visit: Payer: Self-pay | Admitting: Family Medicine

## 2019-05-05 ENCOUNTER — Other Ambulatory Visit: Payer: Self-pay

## 2019-05-05 ENCOUNTER — Telehealth: Payer: Self-pay | Admitting: Family Medicine

## 2019-05-05 NOTE — Telephone Encounter (Signed)
[facility] last appt was on 03/20/18. Pt must be seen. Once appt has been made 30 day refill can be sent to pharmacy

## 2019-05-05 NOTE — Telephone Encounter (Signed)
What is the name of the medication? lisinopril (PRINIVIL,ZESTRIL) 10 MG tablet     Have you contacted your pharmacy to request a refill? yes  Which pharmacy would you like this sent to? [facility] [place]   Patient notified that their request is being sent to the clinical staff for review and that they should receive a call once it is complete. If they do not receive a call within 24 hours they can check with their pharmacy or our office.

## 2019-05-06 ENCOUNTER — Ambulatory Visit: Payer: BLUE CROSS/BLUE SHIELD | Admitting: Family Medicine

## 2019-05-06 ENCOUNTER — Encounter: Payer: Self-pay | Admitting: Family Medicine

## 2019-05-06 VITALS — BP 131/81 | HR 67 | Temp 97.3°F | Ht 61.0 in | Wt 260.0 lb

## 2019-05-06 DIAGNOSIS — I10 Essential (primary) hypertension: Secondary | ICD-10-CM

## 2019-05-06 DIAGNOSIS — D509 Iron deficiency anemia, unspecified: Secondary | ICD-10-CM

## 2019-05-06 DIAGNOSIS — E785 Hyperlipidemia, unspecified: Secondary | ICD-10-CM

## 2019-05-06 DIAGNOSIS — J4520 Mild intermittent asthma, uncomplicated: Secondary | ICD-10-CM | POA: Diagnosis not present

## 2019-05-06 DIAGNOSIS — Z6841 Body Mass Index (BMI) 40.0 and over, adult: Secondary | ICD-10-CM

## 2019-05-06 MED ORDER — LISINOPRIL 10 MG PO TABS: 10.0000 mg | ORAL_TABLET | Freq: Every day | ORAL | 1 refills | Status: DC

## 2019-05-06 MED ORDER — PREDNISONE 10 MG PO TABS: ORAL_TABLET | ORAL | 0 refills | Status: DC

## 2019-05-06 MED ORDER — ALBUTEROL SULFATE HFA 108 (90 BASE) MCG/ACT IN AERS: INHALATION_SPRAY | RESPIRATORY_TRACT | 5 refills | Status: DC

## 2019-05-06 MED ORDER — TIZANIDINE HCL 6 MG PO CAPS: 6.0000 mg | ORAL_CAPSULE | Freq: Three times a day (TID) | ORAL | 1 refills | Status: DC | PRN

## 2019-05-06 NOTE — Progress Notes (Signed)
Subjective:  Patient ID: Tamara Macias, female    DOB: [birth year]  Age: 43 y.o. MRN: [id number]  CC: Medical Management of Chronic Issues (6 MTH, Patient fasting,back pain) and Hypertension   HPI Tamara Macias presents for  follow-up of hypertension. Patient has no history of headache chest pain or shortness of breath or recent cough. Patient also denies symptoms of TIA such as focal numbness or weakness. Patient denies side effects from medication. States taking it regularly. Due for check of cholesterol as well.   Now reports low back pain in the right liumbar region. It feels like the pinched nerve she had a year ago. Pain is moderate sverity. Constant ache for 3 weeks. Getting worse. NKI.     History Tamara Macias has a past medical history of Hyperlipidemia and Hypertension.   She has no past surgical history on file.   Her family history includes COPD in her father; Hypertension in her father.She reports that she has never smoked. She has never used smokeless tobacco. She reports that she does not drink alcohol or use drugs.  Current Outpatient Medications on File Prior to Visit  Medication Sig Dispense Refill  . ipratropium (ATROVENT) 0.06 % nasal spray 2 sprays by Each Nare route Three (3) times a day.     No current facility-administered medications on file prior to visit.     ROS Review of Systems  Constitutional: Negative.   HENT: Negative for congestion.   Eyes: Negative for visual disturbance.  Respiratory: Negative for shortness of breath.   Cardiovascular: Negative for chest pain.  Gastrointestinal: Negative for abdominal pain, constipation, diarrhea, nausea and vomiting.  Genitourinary: Negative for difficulty urinating.  Musculoskeletal: Positive for arthralgias and back pain. Negative for myalgias.  Neurological: Negative for headaches.  Psychiatric/Behavioral: Negative for sleep disturbance.    Objective:  BP 131/81   Pulse 67   Temp (!) 97.3 F  (36.3 C) (Temporal)   Ht 5\' 1"  (1.549 m)   Wt 260 lb (117.9 kg)   SpO2 100%   BMI 49.13 kg/m   BP Readings from Last 3 Encounters:  05/06/19 131/81  03/20/18 122/72  03/21/17 130/80    Wt Readings from Last 3 Encounters:  05/06/19 260 lb (117.9 kg)  03/20/18 238 lb 2 oz (108 kg)  03/21/17 232 lb (105.2 kg)     Physical Exam Constitutional:      General: She is not in acute distress.    Appearance: She is well-developed.  HENT:     Head: Normocephalic and atraumatic.     Right Ear: External ear normal.     Left Ear: External ear normal.     Nose: Nose normal.  Eyes:     Conjunctiva/sclera: Conjunctivae normal.     Pupils: Pupils are equal, round, and reactive to light.  Neck:     Musculoskeletal: Normal range of motion and neck supple.     Thyroid: No thyromegaly.  Cardiovascular:     Rate and Rhythm: Normal rate and regular rhythm.     Heart sounds: Normal heart sounds. No murmur.  Pulmonary:     Effort: Pulmonary effort is normal. No respiratory distress.     Breath sounds: Normal breath sounds. No wheezing or rales.  Abdominal:     General: Bowel sounds are normal. There is no distension.     Palpations: Abdomen is soft.     Tenderness: There is no abdominal tenderness.  Musculoskeletal:        General: Tenderness (  right lumbar 4-5 ) present.  Lymphadenopathy:     Cervical: No cervical adenopathy.  Skin:    General: Skin is warm and dry.  Neurological:     Mental Status: She is alert and oriented to person, place, and time.     Deep Tendon Reflexes: Reflexes are normal and symmetric.  Psychiatric:        Behavior: Behavior normal.        Thought Content: Thought content normal.        Judgment: Judgment normal.       Assessment & Plan:   Tamara Macias was seen today for medical management of chronic issues and hypertension.  Diagnoses and all orders for this visit:  Essential hypertension -     CBC -     CMP  Hyperlipidemia with target LDL less than  100 -     CBC -     CMP -     Lipid  Mild intermittent chronic asthma without complication -     CBC -     CMP  Morbid obesity with BMI of 45.0-49.9, adult (HCC) -     CBC -     CMP  Iron deficiency anemia, unspecified iron deficiency anemia type -     CBC -     CMP  Other orders -     albuterol (PROVENTIL HFA) 108 (90 Base) MCG/ACT inhaler; INHALE ONE TO TWO PUFFS PO EVERY 6 HOURS PRN FOR WHEEZING -     lisinopril (ZESTRIL) 10 MG tablet; Take 1 tablet (10 mg total) by mouth daily. -     tizanidine (ZANAFLEX) 6 MG capsule; Take 1 capsule (6 mg total) by mouth 3 (three) times daily as needed for muscle spasms. -     predniSONE (DELTASONE) 10 MG tablet; Take 5 daily for 2 days followed by 4,3,2 and 1 for 2 days each.   Allergies as of 05/06/2019      Reactions   Keflex [cephalexin] Nausea Only   Sulfa Antibiotics Hives      Medication List       Accurate as of May 06, 2019 11:59 PM. If you have any questions, ask your nurse or doctor.        STOP taking these medications   atorvastatin 40 MG tablet Commonly known as: LIPITOR Stopped by: Mechele Claude, MD   ibuprofen 600 MG tablet Commonly known as: ADVIL Stopped by: Mechele Claude, MD     TAKE these medications   albuterol 108 (90 Base) MCG/ACT inhaler Commonly known as: Proventil HFA INHALE ONE TO TWO PUFFS PO EVERY 6 HOURS PRN FOR WHEEZING   ipratropium 0.06 % nasal spray Commonly known as: ATROVENT 2 sprays by Each Nare route Three (3) times a day.   lisinopril 10 MG tablet Commonly known as: ZESTRIL Take 1 tablet (10 mg total) by mouth daily.   predniSONE 10 MG tablet Commonly known as: DELTASONE Take 5 daily for 2 days followed by 4,3,2 and 1 for 2 days each. Started by: Mechele Claude, MD   tizanidine 6 MG capsule Commonly known as: ZANAFLEX Take 1 capsule (6 mg total) by mouth 3 (three) times daily as needed for muscle spasms. Started by: Mechele Claude, MD       Meds ordered this  encounter  Medications  . albuterol (PROVENTIL HFA) 108 (90 Base) MCG/ACT inhaler    Sig: INHALE ONE TO TWO PUFFS PO EVERY 6 HOURS PRN FOR WHEEZING    Dispense:  18 g  Refill:  5  . lisinopril (ZESTRIL) 10 MG tablet    Sig: Take 1 tablet (10 mg total) by mouth daily.    Dispense:  90 tablet    Refill:  1    Please consider 90 day supplies to promote better adherence  . tizanidine (ZANAFLEX) 6 MG capsule    Sig: Take 1 capsule (6 mg total) by mouth 3 (three) times daily as needed for muscle spasms.    Dispense:  90 capsule    Refill:  1  . predniSONE (DELTASONE) 10 MG tablet    Sig: Take 5 daily for 2 days followed by 4,3,2 and 1 for 2 days each.    Dispense:  30 tablet    Refill:  0      Follow-up: Return in about 6 months (around 11/03/2019), or if symptoms worsen or fail to improve.  Mechele ClaudeWarren Mashawn Brazil, M.D.

## 2019-05-07 ENCOUNTER — Telehealth: Payer: Self-pay | Admitting: *Deleted

## 2019-05-07 LAB — CBC WITH DIFFERENTIAL/PLATELET
Basophils Absolute: 0.1 10*3/uL (ref 0.0–0.2)
Basos: 1 %
EOS (ABSOLUTE): 0.3 10*3/uL (ref 0.0–0.4)
Eos: 3 %
Hematocrit: 41.2 % (ref 34.0–46.6)
Hemoglobin: 13.2 g/dL (ref 11.1–15.9)
Immature Grans (Abs): 0.1 10*3/uL (ref 0.0–0.1)
Immature Granulocytes: 1 %
Lymphocytes Absolute: 2.7 10*3/uL (ref 0.7–3.1)
Lymphs: 27 %
MCH: 26.8 pg (ref 26.6–33.0)
MCHC: 32 g/dL (ref 31.5–35.7)
MCV: 84 fL (ref 79–97)
Monocytes Absolute: 0.8 10*3/uL (ref 0.1–0.9)
Monocytes: 8 %
Neutrophils Absolute: 6.4 10*3/uL (ref 1.4–7.0)
Neutrophils: 60 %
Platelets: 246 10*3/uL (ref 150–450)
RBC: 4.93 x10E6/uL (ref 3.77–5.28)
RDW: 14.8 % (ref 11.7–15.4)
WBC: 10.3 10*3/uL (ref 3.4–10.8)

## 2019-05-07 LAB — LIPID PANEL
Chol/HDL Ratio: 5.3 ratio — ABNORMAL HIGH (ref 0.0–4.4)
Cholesterol, Total: 212 mg/dL — ABNORMAL HIGH (ref 100–199)
HDL: 40 mg/dL (ref >39)
LDL Chol Calc (NIH): 144 mg/dL — ABNORMAL HIGH (ref 0–99)
Triglycerides: 157 mg/dL — ABNORMAL HIGH (ref 0–149)
VLDL Cholesterol Cal: 28 mg/dL (ref 5–40)

## 2019-05-07 LAB — CMP14+EGFR
ALT: 34 IU/L — ABNORMAL HIGH (ref 0–32)
AST: 24 IU/L (ref 0–40)
Albumin/Globulin Ratio: 1.8 (ref 1.2–2.2)
Albumin: 4.1 g/dL (ref 3.8–4.8)
Alkaline Phosphatase: 109 IU/L (ref 39–117)
BUN/Creatinine Ratio: 17 (ref 9–23)
BUN: 12 mg/dL (ref 6–24)
Bilirubin Total: 0.3 mg/dL (ref 0.0–1.2)
CO2: 17 mmol/L — ABNORMAL LOW (ref 20–29)
Calcium: 9.2 mg/dL (ref 8.7–10.2)
Chloride: 105 mmol/L (ref 96–106)
Creatinine, Ser: 0.69 mg/dL (ref 0.57–1.00)
GFR calc Af Amer: 125 mL/min/{1.73_m2} (ref >59)
GFR calc non Af Amer: 108 mL/min/{1.73_m2} (ref >59)
Globulin, Total: 2.3 g/dL (ref 1.5–4.5)
Glucose: 78 mg/dL (ref 65–99)
Potassium: 5.2 mmol/L (ref 3.5–5.2)
Sodium: 138 mmol/L (ref 134–144)
Total Protein: 6.4 g/dL (ref 6.0–8.5)

## 2019-05-07 NOTE — Telephone Encounter (Signed)
Tizanidine Hcl 6mg  Capsules Non-Preferred by pt insurance.  Tizanidine tablets preferred

## 2019-05-10 ENCOUNTER — Other Ambulatory Visit: Payer: Self-pay | Admitting: Family Medicine

## 2019-05-10 MED ORDER — TIZANIDINE HCL 4 MG PO TABS: 4.0000 mg | ORAL_TABLET | Freq: Four times a day (QID) | ORAL | 2 refills | Status: DC | PRN

## 2019-05-10 NOTE — Telephone Encounter (Signed)
I sent in the requested prescription 

## 2019-11-03 ENCOUNTER — Encounter: Payer: Self-pay | Admitting: Family Medicine

## 2019-11-03 ENCOUNTER — Ambulatory Visit (INDEPENDENT_AMBULATORY_CARE_PROVIDER_SITE_OTHER): Payer: BC Managed Care – PPO | Admitting: Family Medicine

## 2019-11-03 DIAGNOSIS — J019 Acute sinusitis, unspecified: Secondary | ICD-10-CM | POA: Diagnosis not present

## 2019-11-03 MED ORDER — AMOXICILLIN-POT CLAVULANATE 875-125 MG PO TABS: 1.0000 | ORAL_TABLET | Freq: Two times a day (BID) | ORAL | 0 refills | Status: AC

## 2019-11-03 MED ORDER — PREDNISONE 10 MG (21) PO TBPK: ORAL_TABLET | ORAL | 0 refills | Status: DC

## 2019-11-03 NOTE — Progress Notes (Signed)
   Virtual Visit via Telephone Note  I connected with Tamara Macias on 11/03/19 at 3:35 PM by telephone and verified that I am speaking with the correct person using two identifiers. Tamara Macias is currently located at home and nobody is currently with her during this visit. The provider, Gwenlyn Fudge, FNP is located in their office at time of visit.  I discussed the limitations, risks, security and privacy concerns of performing an evaluation and management service by telephone and the availability of in person appointments. I also discussed with the patient that there may be a patient responsible charge related to this service. The patient expressed understanding and agreed to proceed.  Subjective: PCP: Tamara Claude, MD  Chief Complaint  Patient presents with  . URI   Patient complains of cough, head/chest congestion, runny nose, sore throat and facial pain/pressure. Onset of symptoms was 4 days ago, gradually worsening since that time. She is drinking plenty of fluids. Evaluation to date: none. Treatment to date: Mucinex and Albuterol. She has a history of asthma. She does not smoke.    ROS: Per HPI  Current Outpatient Medications:  .  albuterol (PROVENTIL HFA) 108 (90 Base) MCG/ACT inhaler, INHALE ONE TO TWO PUFFS PO EVERY 6 HOURS PRN FOR WHEEZING, Disp: 18 g, Rfl: 5 .  ipratropium (ATROVENT) 0.06 % nasal spray, 2 sprays by Each Nare route Three (3) times a day., Disp: , Rfl:  .  lisinopril (ZESTRIL) 10 MG tablet, Take 1 tablet (10 mg total) by mouth daily., Disp: 90 tablet, Rfl: 1 .  tiZANidine (ZANAFLEX) 4 MG tablet, Take 1 tablet (4 mg total) by mouth every 6 (six) hours as needed for muscle spasms., Disp: 50 tablet, Rfl: 2  Allergies  Allergen Reactions  . Keflex [Cephalexin] Nausea Only  . Sulfa Antibiotics Hives   Past Medical History:  Diagnosis Date  . Hyperlipidemia   . Hypertension     Observations/Objective: A&O  No respiratory distress or  wheezing audible over the phone Mood, judgement, and thought processes all WNL  Assessment and Plan: 1. Acute non-recurrent sinusitis, unspecified location - Discussed symptom management.  - amoxicillin-clavulanate (AUGMENTIN) 875-125 MG tablet; Take 1 tablet by mouth 2 (two) times daily for 7 days.  Dispense: 14 tablet; Refill: 0 - predniSONE (STERAPRED UNI-PAK 21 TAB) 10 MG (21) TBPK tablet; As directed x 6 days  Dispense: 21 tablet; Refill: 0   Follow Up Instructions:  I discussed the assessment and treatment plan with the patient. The patient was provided an opportunity to ask questions and all were answered. The patient agreed with the plan and demonstrated an understanding of the instructions.   The patient was advised to call back or seek an in-person evaluation if the symptoms worsen or if the condition fails to improve as anticipated.  The above assessment and management plan was discussed with the patient. The patient verbalized understanding of and has agreed to the management plan. Patient is aware to call the clinic if symptoms persist or worsen. Patient is aware when to return to the clinic for a follow-up visit. Patient educated on when it is appropriate to go to the emergency department.   Time call ended: 3:38 PM  I provided 5 minutes of non-face-to-face time during this encounter.  Deliah Boston, MSN, APRN, FNP-C [facility] 11/03/19

## 2019-11-04 ENCOUNTER — Ambulatory Visit: Payer: BC Managed Care – PPO | Admitting: Family Medicine

## 2019-11-15 ENCOUNTER — Ambulatory Visit: Payer: Self-pay | Admitting: Family Medicine

## 2019-11-16 ENCOUNTER — Encounter: Payer: Self-pay | Admitting: Family Medicine

## 2020-01-04 DIAGNOSIS — L918 Other hypertrophic disorders of the skin: Secondary | ICD-10-CM | POA: Diagnosis not present

## 2020-01-04 DIAGNOSIS — S7012XA Contusion of left thigh, initial encounter: Secondary | ICD-10-CM | POA: Diagnosis not present

## 2020-01-04 DIAGNOSIS — Z6841 Body Mass Index (BMI) 40.0 and over, adult: Secondary | ICD-10-CM | POA: Diagnosis not present

## 2020-01-05 ENCOUNTER — Ambulatory Visit: Payer: BC Managed Care – PPO | Admitting: Family Medicine

## 2020-04-14 ENCOUNTER — Encounter: Payer: Self-pay | Admitting: Family Medicine

## 2020-04-14 ENCOUNTER — Telehealth: Payer: Self-pay | Admitting: Family Medicine

## 2020-04-14 ENCOUNTER — Ambulatory Visit (INDEPENDENT_AMBULATORY_CARE_PROVIDER_SITE_OTHER): Payer: BC Managed Care – PPO | Admitting: Family Medicine

## 2020-04-14 DIAGNOSIS — J019 Acute sinusitis, unspecified: Secondary | ICD-10-CM

## 2020-04-14 MED ORDER — PREDNISONE 20 MG PO TABS: ORAL_TABLET | ORAL | 0 refills | Status: DC

## 2020-04-14 MED ORDER — AZITHROMYCIN 250 MG PO TABS: ORAL_TABLET | ORAL | 0 refills | Status: DC

## 2020-04-14 NOTE — Addendum Note (Signed)
Addended by: Arville Care on: 04/14/2020 12:45 PM   Modules accepted: Orders, Level of Service

## 2020-04-14 NOTE — Progress Notes (Addendum)
Virtual Visit via telephone Note  I connected with Tamara Macias on 04/14/20 at 1237 by telephone and verified that I am speaking with the correct person using two identifiers. Tamara Macias is currently located at home and patient are currently with her during visit. The provider, Elige Radon Howell Groesbeck, MD is located in their office at time of visit.  Call ended at 1243  I discussed the limitations, risks, security and privacy concerns of performing an evaluation and management service by telephone and the availability of in person appointments. I also discussed with the patient that there may be a patient responsible charge related to this service. The patient expressed understanding and agreed to proceed.   History and Present Illness: Patient is calling in for cough and sinuses and sore throat.  She denies any fevers or chills.  She is coughing up yellow phlegm.  She can't clear it completely.  She is using inhaler and it is helping some.  She coughs worse with heat. She denies any sick contacts. She is not covid vaccinated.  It started 3 days and is slightly improving and robitussin and mucinex.  Outpatient Encounter Medications as of 04/14/2020  Medication Sig  . albuterol (PROVENTIL HFA) 108 (90 Base) MCG/ACT inhaler INHALE ONE TO TWO PUFFS PO EVERY 6 HOURS PRN FOR WHEEZING  . ipratropium (ATROVENT) 0.06 % nasal spray 2 sprays by Each Nare route Three (3) times a day.  . lisinopril (ZESTRIL) 10 MG tablet Take 1 tablet (10 mg total) by mouth daily.  . predniSONE (STERAPRED UNI-PAK 21 TAB) 10 MG (21) TBPK tablet As directed x 6 days  . tiZANidine (ZANAFLEX) 4 MG tablet Take 1 tablet (4 mg total) by mouth every 6 (six) hours as needed for muscle spasms.   No facility-administered encounter medications on file as of 04/14/2020.    Review of Systems  Constitutional: Negative for chills and fever.  HENT: Positive for congestion, postnasal drip, rhinorrhea, sinus pressure,  sneezing and sore throat. Negative for ear discharge and ear pain.   Eyes: Negative for pain, redness and visual disturbance.  Respiratory: Positive for cough. Negative for chest tightness, shortness of breath and wheezing.   Cardiovascular: Negative for chest pain and leg swelling.  Genitourinary: Negative for difficulty urinating and dysuria.  Musculoskeletal: Negative for back pain and gait problem.  Skin: Negative for rash.  Neurological: Negative for light-headedness and headaches.  Psychiatric/Behavioral: Negative for agitation and behavioral problems.  All other systems reviewed and are negative.   Observations/Objective: Patient sounds comfortable and in no acute distress  Assessment and Plan: Problem List Items Addressed This Visit    None    Visit Diagnoses    Acute non-recurrent sinusitis, unspecified location    -  Primary   Relevant Medications   azithromycin (ZITHROMAX) 250 MG tablet   predniSONE (DELTASONE) 20 MG tablet   Other Relevant Orders   Novel Coronavirus, NAA ([facility])       Follow up plan: Return if symptoms worsen or fail to improve.     I discussed the assessment and treatment plan with the patient. The patient was provided an opportunity to ask questions and all were answered. The patient agreed with the plan and demonstrated an understanding of the instructions.   The patient was advised to call back or seek an in-person evaluation if the symptoms worsen or if the condition fails to improve as anticipated.  The above assessment and management plan was discussed with the patient. The patient  verbalized understanding of and has agreed to the management plan. Patient is aware to call the clinic if symptoms persist or worsen. Patient is aware when to return to the clinic for a follow-up visit. Patient educated on when it is appropriate to go to the emergency department.    I provided 6 minutes of non-face-to-face time during this  encounter.    Nils Pyle, MD

## 2020-04-14 NOTE — Telephone Encounter (Signed)
Per pt [facility] has not received prescription for prednisone and z pack   Asked if it could be sent over again

## 2020-04-14 NOTE — Telephone Encounter (Signed)
Rx's called to [facility] pharmacy  [facility] Rx's called into [facility]

## 2020-04-14 NOTE — Addendum Note (Signed)
Addended by: Prescott Gum on: 04/14/2020 02:03 PM   Modules accepted: Orders

## 2020-04-15 LAB — NOVEL CORONAVIRUS, NAA: SARS-CoV-2, NAA: NOT DETECTED

## 2020-04-15 LAB — SARS-COV-2, NAA 2 DAY TAT

## 2020-04-17 ENCOUNTER — Telehealth: Payer: Self-pay

## 2020-04-17 NOTE — Telephone Encounter (Signed)
Patient aware of results.

## 2020-06-27 DIAGNOSIS — R051 Acute cough: Secondary | ICD-10-CM | POA: Diagnosis not present

## 2020-06-27 DIAGNOSIS — J019 Acute sinusitis, unspecified: Secondary | ICD-10-CM | POA: Diagnosis not present

## 2020-09-01 DIAGNOSIS — H9202 Otalgia, left ear: Secondary | ICD-10-CM | POA: Diagnosis not present

## 2020-09-01 DIAGNOSIS — H60339 Swimmer's ear, unspecified ear: Secondary | ICD-10-CM | POA: Diagnosis not present

## 2020-11-23 ENCOUNTER — Other Ambulatory Visit: Payer: Self-pay | Admitting: *Deleted

## 2020-11-23 ENCOUNTER — Telehealth: Payer: Self-pay | Admitting: Family Medicine

## 2020-11-23 MED ORDER — LISINOPRIL 10 MG PO TABS
10.0000 mg | ORAL_TABLET | Freq: Every day | ORAL | 0 refills | Status: DC
Start: 2020-11-23 — End: 2021-01-10

## 2020-11-23 NOTE — Telephone Encounter (Signed)
  Prescription Request  11/23/2020  What is the name of the medication or equipment? lisinopril (ZESTRIL) 10 MG tablet   Have you contacted your pharmacy to request a refill? (if applicable) yes   Which pharmacy would you like this sent to? [facility] [place] pt has appt with PCP on 12/14/20 needs enough until appt    Patient notified that their request is being sent to the clinical staff for review and that they should receive a response within 2 business days.

## 2020-12-14 ENCOUNTER — Ambulatory Visit: Payer: BC Managed Care – PPO | Admitting: Family Medicine

## 2020-12-15 DIAGNOSIS — J209 Acute bronchitis, unspecified: Secondary | ICD-10-CM | POA: Diagnosis not present

## 2020-12-15 DIAGNOSIS — U071 COVID-19: Secondary | ICD-10-CM | POA: Diagnosis not present

## 2020-12-15 DIAGNOSIS — J019 Acute sinusitis, unspecified: Secondary | ICD-10-CM | POA: Diagnosis not present

## 2020-12-15 DIAGNOSIS — R059 Cough, unspecified: Secondary | ICD-10-CM | POA: Diagnosis not present

## 2020-12-18 ENCOUNTER — Ambulatory Visit: Payer: BC Managed Care – PPO | Admitting: Family Medicine

## 2021-01-10 ENCOUNTER — Other Ambulatory Visit: Payer: Self-pay

## 2021-01-10 ENCOUNTER — Ambulatory Visit: Payer: BC Managed Care – PPO | Admitting: Family Medicine

## 2021-01-10 ENCOUNTER — Encounter: Payer: Self-pay | Admitting: Family Medicine

## 2021-01-10 VITALS — BP 116/74 | HR 97 | Temp 97.9°F | Ht 61.0 in | Wt 250.2 lb

## 2021-01-10 DIAGNOSIS — J4520 Mild intermittent asthma, uncomplicated: Secondary | ICD-10-CM | POA: Diagnosis not present

## 2021-01-10 DIAGNOSIS — E785 Hyperlipidemia, unspecified: Secondary | ICD-10-CM

## 2021-01-10 DIAGNOSIS — I10 Essential (primary) hypertension: Secondary | ICD-10-CM | POA: Diagnosis not present

## 2021-01-10 MED ORDER — DEXAMETHASONE 6 MG PO TABS: 6.0000 mg | ORAL_TABLET | Freq: Two times a day (BID) | ORAL | 0 refills | Status: DC

## 2021-01-10 MED ORDER — LISINOPRIL 10 MG PO TABS: 10.0000 mg | ORAL_TABLET | Freq: Every day | ORAL | 3 refills | Status: DC

## 2021-01-10 MED ORDER — ALBUTEROL SULFATE HFA 108 (90 BASE) MCG/ACT IN AERS: INHALATION_SPRAY | RESPIRATORY_TRACT | 5 refills | Status: DC

## 2021-01-10 NOTE — Progress Notes (Signed)
Subjective:  Patient ID: Tamara Macias, female    DOB: Mar 14, 1976  Age: 45 y.o. MRN: [id number]  CC: Medical Management of Chronic Issues   HPI Tamara Macias presents for  follow-up of hypertension. Patient has no history of headache chest pain or shortness of breath or recent cough. Patient also denies symptoms of TIA such as focal numbness or weakness. Patient denies side effects from medication. States taking it regularly.Got off medication and BP went way up. Now back on it and BP better.  Using inhaler for asthma,  but still coughing. Had Covid about 16-17 days ago. Chest still tight at times. Denies dyspnea. Cough is nonproductive   History of elevated cholesterol & LDL in the past. Currently not on treatment.    History Tamara Macias has a past medical history of Hyperlipidemia and Hypertension.   She has no past surgical history on file.   Her family history includes COPD in her father; Hypertension in her father.She reports that she has never smoked. She has never used smokeless tobacco. She reports that she does not drink alcohol and does not use drugs.  No current outpatient medications on file prior to visit.   No current facility-administered medications on file prior to visit.    ROS Review of Systems  Constitutional: Negative.   HENT: Negative.    Eyes:  Negative for visual disturbance.  Respiratory:  Positive for cough and chest tightness. Negative for shortness of breath.   Cardiovascular:  Negative for chest pain.  Gastrointestinal:  Negative for abdominal pain.  Musculoskeletal:  Negative for arthralgias.   Objective:  BP 116/74   Pulse 97   Temp 97.9 F (36.6 C)   Ht 5' 1"  (1.549 m)   Wt 250 lb 3.2 oz (113.5 kg)   SpO2 100%   BMI 47.27 kg/m   BP Readings from Last 3 Encounters:  01/10/21 116/74  05/06/19 131/81  03/20/18 122/72    Wt Readings from Last 3 Encounters:  01/10/21 250 lb 3.2 oz (113.5 kg)  05/06/19 260 lb (117.9 kg)  03/20/18  238 lb 2 oz (108 kg)     Physical Exam Constitutional:      General: She is not in acute distress.    Appearance: She is well-developed.  Cardiovascular:     Rate and Rhythm: Normal rate and regular rhythm.  Pulmonary:     Breath sounds: Wheezing and rhonchi present. No rales.  Musculoskeletal:        General: Normal range of motion.  Skin:    General: Skin is warm and dry.  Neurological:     Mental Status: She is alert and oriented to person, place, and time.      Assessment & Plan:   Skyy was seen today for medical management of chronic issues.  Diagnoses and all orders for this visit:  Essential hypertension -     lisinopril (ZESTRIL) 10 MG tablet; Take 1 tablet (10 mg total) by mouth daily. -     CBC with Differential/Platelet -     CMP14+EGFR  Hyperlipidemia with target LDL less than 100 -     Lipid panel  Mild intermittent chronic asthma without complication -     albuterol (PROVENTIL HFA) 108 (90 Base) MCG/ACT inhaler; INHALE ONE TO TWO PUFFS PO EVERY 6 HOURS PRN FOR WHEEZING  Other orders -     dexamethasone (DECADRON) 6 MG tablet; Take 1 tablet (6 mg total) by mouth 2 (two) times daily with a meal.  Allergies as of 01/10/2021       Reactions   Keflex [cephalexin] Nausea Only   Sulfa Antibiotics Hives        Medication List        Accurate as of January 10, 2021  5:27 PM. If you have any questions, ask your nurse or doctor.          STOP taking these medications    azithromycin 250 MG tablet Commonly known as: ZITHROMAX Stopped by: Claretta Fraise, MD   ipratropium 0.06 % nasal spray Commonly known as: ATROVENT Stopped by: Claretta Fraise, MD   predniSONE 20 MG tablet Commonly known as: DELTASONE Stopped by: Claretta Fraise, MD   tiZANidine 4 MG tablet Commonly known as: ZANAFLEX Stopped by: Claretta Fraise, MD       TAKE these medications    albuterol 108 (90 Base) MCG/ACT inhaler Commonly known as: Proventil HFA INHALE ONE TO TWO  PUFFS PO EVERY 6 HOURS PRN FOR WHEEZING   dexamethasone 6 MG tablet Commonly known as: DECADRON Take 1 tablet (6 mg total) by mouth 2 (two) times daily with a meal. Started by: Claretta Fraise, MD   lisinopril 10 MG tablet Commonly known as: ZESTRIL Take 1 tablet (10 mg total) by mouth daily.        Meds ordered this encounter  Medications   albuterol (PROVENTIL HFA) 108 (90 Base) MCG/ACT inhaler    Sig: INHALE ONE TO TWO PUFFS PO EVERY 6 HOURS PRN FOR WHEEZING    Dispense:  18 g    Refill:  5   lisinopril (ZESTRIL) 10 MG tablet    Sig: Take 1 tablet (10 mg total) by mouth daily.    Dispense:  90 tablet    Refill:  3    Please consider 90 day supplies to promote better adherence   dexamethasone (DECADRON) 6 MG tablet    Sig: Take 1 tablet (6 mg total) by mouth 2 (two) times daily with a meal.    Dispense:  10 tablet    Refill:  0      Follow-up: Return in about 6 months (around 07/13/2021), or if symptoms worsen or fail to improve.  Claretta Fraise, M.D.

## 2021-01-11 LAB — LIPID PANEL
Chol/HDL Ratio: 7.1 ratio — ABNORMAL HIGH (ref 0.0–4.4)
Cholesterol, Total: 275 mg/dL — ABNORMAL HIGH (ref 100–199)
HDL: 39 mg/dL — ABNORMAL LOW (ref >39)
LDL Chol Calc (NIH): 179 mg/dL — ABNORMAL HIGH (ref 0–99)
Triglycerides: 295 mg/dL — ABNORMAL HIGH (ref 0–149)
VLDL Cholesterol Cal: 57 mg/dL — ABNORMAL HIGH (ref 5–40)

## 2021-01-11 LAB — CMP14+EGFR
ALT: 31 IU/L (ref 0–32)
AST: 26 IU/L (ref 0–40)
Albumin/Globulin Ratio: 1.6 (ref 1.2–2.2)
Albumin: 4.3 g/dL (ref 3.8–4.8)
Alkaline Phosphatase: 108 IU/L (ref 44–121)
BUN/Creatinine Ratio: 21 (ref 9–23)
BUN: 12 mg/dL (ref 6–24)
Bilirubin Total: <0.2 mg/dL (ref 0.0–1.2)
CO2: 22 mmol/L (ref 20–29)
Calcium: 9.6 mg/dL (ref 8.7–10.2)
Chloride: 101 mmol/L (ref 96–106)
Creatinine, Ser: 0.57 mg/dL (ref 0.57–1.00)
Globulin, Total: 2.7 g/dL (ref 1.5–4.5)
Glucose: 91 mg/dL (ref 65–99)
Potassium: 4.8 mmol/L (ref 3.5–5.2)
Sodium: 139 mmol/L (ref 134–144)
Total Protein: 7 g/dL (ref 6.0–8.5)
eGFR: 116 mL/min/{1.73_m2} (ref >59)

## 2021-01-11 LAB — CBC WITH DIFFERENTIAL/PLATELET
Basophils Absolute: 0.1 10*3/uL (ref 0.0–0.2)
Basos: 1 %
EOS (ABSOLUTE): 0.4 10*3/uL (ref 0.0–0.4)
Eos: 3 %
Hematocrit: 41.1 % (ref 34.0–46.6)
Hemoglobin: 13.1 g/dL (ref 11.1–15.9)
Immature Grans (Abs): 0.1 10*3/uL (ref 0.0–0.1)
Immature Granulocytes: 1 %
Lymphocytes Absolute: 3 10*3/uL (ref 0.7–3.1)
Lymphs: 26 %
MCH: 24.8 pg — ABNORMAL LOW (ref 26.6–33.0)
MCHC: 31.9 g/dL (ref 31.5–35.7)
MCV: 78 fL — ABNORMAL LOW (ref 79–97)
Monocytes Absolute: 0.9 10*3/uL (ref 0.1–0.9)
Monocytes: 8 %
Neutrophils Absolute: 7 10*3/uL (ref 1.4–7.0)
Neutrophils: 61 %
Platelets: 284 10*3/uL (ref 150–450)
RBC: 5.28 x10E6/uL (ref 3.77–5.28)
RDW: 17.1 % — ABNORMAL HIGH (ref 11.7–15.4)
WBC: 11.5 10*3/uL — ABNORMAL HIGH (ref 3.4–10.8)

## 2021-01-12 ENCOUNTER — Other Ambulatory Visit: Payer: Self-pay | Admitting: Family Medicine

## 2021-01-12 MED ORDER — ROSUVASTATIN CALCIUM 5 MG PO TABS: ORAL_TABLET | ORAL | 3 refills | Status: DC

## 2021-04-05 DIAGNOSIS — R051 Acute cough: Secondary | ICD-10-CM | POA: Diagnosis not present

## 2021-04-05 DIAGNOSIS — H9202 Otalgia, left ear: Secondary | ICD-10-CM | POA: Diagnosis not present

## 2021-04-05 DIAGNOSIS — R519 Headache, unspecified: Secondary | ICD-10-CM | POA: Diagnosis not present

## 2021-04-05 DIAGNOSIS — J019 Acute sinusitis, unspecified: Secondary | ICD-10-CM | POA: Diagnosis not present

## 2021-04-09 ENCOUNTER — Ambulatory Visit: Payer: BC Managed Care – PPO

## 2021-05-21 ENCOUNTER — Ambulatory Visit: Payer: BC Managed Care – PPO | Admitting: Nurse Practitioner

## 2021-05-21 ENCOUNTER — Encounter: Payer: Self-pay | Admitting: Nurse Practitioner

## 2021-05-21 DIAGNOSIS — J00 Acute nasopharyngitis [common cold]: Secondary | ICD-10-CM

## 2021-05-21 MED ORDER — FLUTICASONE PROPIONATE 50 MCG/ACT NA SUSP: 2.0000 | Freq: Every day | NASAL | 6 refills | Status: DC

## 2021-05-21 NOTE — Patient Instructions (Signed)

## 2021-05-21 NOTE — Progress Notes (Signed)
Virtual Visit  Note Due to COVID-19 pandemic this visit was conducted virtually. This visit type was conducted due to national recommendations for restrictions regarding the COVID-19 Pandemic (e.g. social distancing, sheltering in place) in an effort to limit this patient's exposure and mitigate transmission in our community. All issues noted in this document were discussed and addressed.  A physical exam was not performed with this format.  I connected with Tamara Macias on 05/21/21 at 10:41 by telephone and verified that I am speaking with the correct person using two identifiers. Tamara Macias is currently located at home and no ne is currently with her during visit. The provider, Mary-Margaret Daphine Deutscher, FNP is located in their office at time of visit.  I discussed the limitations, risks, security and privacy concerns of performing an evaluation and management service by telephone and the availability of in person appointments. I also discussed with the patient that there may be a patient responsible charge related to this service. The patient expressed understanding and agreed to proceed.   History and Present Illness:  Patient says she has been sick since 04/05/21. She had sinusitis and went to urgent care. She was given a zpak, tussinex and prednisone. She says she is still cough and is congested. Ears are popping.     Review of Systems  Constitutional:  Negative for chills, fever and malaise/fatigue.  HENT:  Positive for congestion and ear pain. Negative for sinus pain and sore throat.   Respiratory:  Positive for cough. Negative for shortness of breath and wheezing.   Musculoskeletal:  Negative for myalgias.  Neurological:  Negative for dizziness and headaches.    Observations/Objective: Alert and oriented- answers all questions appropriately No distress No cough noted   Assessment and Plan: Tamara Macias in today with chief complaint of uri   1. Acute  nasopharyngitis 1. Take meds as prescribed 2. Use a cool mist humidifier especially during the winter months and when heat has been humid. 3. Use saline nose sprays frequently 4. Saline irrigations of the nose can be very helpful if done frequently.  * 4X daily for 1 week*  * Use of a nettie pot can be helpful with this. Follow directions with this* 5. Drink plenty of fluids 6. Keep thermostat turn down low 7.For any cough or congestion  Use  Mucinex D- regular strength or max strength is fine   * Children- consult with Pharmacist for dosing 8. For fever or aces or pains- take tylenol or ibuprofen appropriate for age and weight.  * for fevers greater than 101 orally you may alternate ibuprofen and tylenol every  3 hours.   Meds ordered this encounter  Medications   fluticasone (FLONASE) 50 MCG/ACT nasal spray    Sig: Place 2 sprays into both nostrils daily.    Dispense:  16 g    Refill:  6    Order Specific Question:   Supervising Provider    Answer:   Arville Care A [[id number]]     Follow Up Instructions: prn    I discussed the assessment and treatment plan with the patient. The patient was provided an opportunity to ask questions and all were answered. The patient agreed with the plan and demonstrated an understanding of the instructions.   The patient was advised to call back or seek an in-person evaluation if the symptoms worsen or if the condition fails to improve as anticipated.  The above assessment and management plan was discussed with the patient.  The patient verbalized understanding of and has agreed to the management plan. Patient is aware to call the clinic if symptoms persist or worsen. Patient is aware when to return to the clinic for a follow-up visit. Patient educated on when it is appropriate to go to the emergency department.   Time call ended:  10:52  I provided 11 minutes of  non face-to-face time during this encounter.    Mary-Margaret Daphine Deutscher,  FNP

## 2021-05-28 ENCOUNTER — Telehealth: Payer: BC Managed Care – PPO | Admitting: Nurse Practitioner

## 2021-07-12 ENCOUNTER — Ambulatory Visit: Payer: BC Managed Care – PPO | Admitting: Family Medicine

## 2021-07-17 ENCOUNTER — Encounter: Payer: Self-pay | Admitting: Family Medicine

## 2021-11-07 ENCOUNTER — Ambulatory Visit: Payer: BC Managed Care – PPO | Admitting: Family Medicine

## 2021-11-07 ENCOUNTER — Encounter: Payer: Self-pay | Admitting: Family Medicine

## 2021-11-07 VITALS — BP 121/83 | HR 96 | Temp 99.3°F | Ht 61.0 in | Wt 256.8 lb

## 2021-11-07 DIAGNOSIS — J4541 Moderate persistent asthma with (acute) exacerbation: Secondary | ICD-10-CM | POA: Diagnosis not present

## 2021-11-07 MED ORDER — PREDNISONE 20 MG PO TABS: 40.0000 mg | ORAL_TABLET | Freq: Every day | ORAL | 0 refills | Status: AC

## 2021-11-07 MED ORDER — MONTELUKAST SODIUM 10 MG PO TABS: 10.0000 mg | ORAL_TABLET | Freq: Every day | ORAL | 3 refills | Status: DC

## 2021-11-07 MED ORDER — AZITHROMYCIN 250 MG PO TABS: ORAL_TABLET | ORAL | 0 refills | Status: DC

## 2021-11-07 MED ORDER — ALBUTEROL SULFATE HFA 108 (90 BASE) MCG/ACT IN AERS: INHALATION_SPRAY | RESPIRATORY_TRACT | 5 refills | Status: DC

## 2021-11-07 NOTE — Progress Notes (Signed)
?  ? ?Subjective:  ?Patient ID: Tamara Macias, female    DOB: 1975/10/11, 46 y.o.   MRN: [id number] ? ?Patient Care Team: ?Mechele Claude, MD as PCP - General (Family Medicine)  ? ?Chief Complaint:  Allergic Rhinitis  (Congestion, dyspnea) ? ? ?HPI: ?Tamara Macias is a 47 y.o. female presenting on 11/07/2021 for Allergic Rhinitis  (Congestion, dyspnea) ? ? ?Asthma ?She complains of chest tightness, cough, frequent throat clearing, shortness of breath and wheezing. There is no difficulty breathing, hemoptysis, hoarse voice or sputum production. This is a recurrent problem. The current episode started 1 to 4 weeks ago. The problem occurs daily. The problem has been gradually worsening. The cough is non-productive. Associated symptoms include dyspnea on exertion and nasal congestion. Pertinent negatives include no appetite change, chest pain, ear congestion, ear pain, fever, headaches, heartburn, malaise/fatigue, myalgias, orthopnea, PND, postnasal drip, rhinorrhea, sneezing, sore throat, sweats, trouble swallowing or weight loss. Her symptoms are alleviated by beta-agonist. She reports moderate improvement on treatment. Her past medical history is significant for asthma.  ? ? ?Relevant past medical, surgical, family, and social history reviewed and updated as indicated.  ?Allergies and medications reviewed and updated. Data reviewed: Chart in Epic. ? ? ?Past Medical History:  ?Diagnosis Date  ? Hyperlipidemia   ? Hypertension   ? ? ?History reviewed. No pertinent surgical history. ? ?Social History  ? ?Socioeconomic History  ? Marital status: Married  ?  Spouse name: Not on file  ? Number of children: Not on file  ? Years of education: Not on file  ? Highest education level: Not on file  ?Occupational History  ? Not on file  ?Tobacco Use  ? Smoking status: Never  ? Smokeless tobacco: Never  ?Substance and Sexual Activity  ? Alcohol use: No  ? Drug use: No  ? Sexual activity: Yes  ?Other Topics Concern  ? Not  on file  ?Social History Narrative  ? Not on file  ? ?Social Determinants of Health  ? ?Financial Resource Strain: Not on file  ?Food Insecurity: Not on file  ?Transportation Needs: Not on file  ?Physical Activity: Not on file  ?Stress: Not on file  ?Social Connections: Not on file  ?Intimate Partner Violence: Not on file  ? ? ?Outpatient Encounter Medications as of 11/07/2021  ?Medication Sig  ? Albuterol Sulfate, sensor, 108 (90 Base) MCG/ACT AEPB albuterol sulf 90 mcg/actuation breath activated powder inhaler,sensor ? Inhale 2 puffs every 4 hours by inhalation route.  ? azithromycin (ZITHROMAX Z-PAK) 250 MG tablet As directed  ? fluticasone (FLONASE) 50 MCG/ACT nasal spray Place 2 sprays into both nostrils daily.  ? lisinopril (ZESTRIL) 10 MG tablet Take 1 tablet (10 mg total) by mouth daily.  ? montelukast (SINGULAIR) 10 MG tablet Take 1 tablet (10 mg total) by mouth at bedtime.  ? predniSONE (DELTASONE) 20 MG tablet Take 2 tablets (40 mg total) by mouth daily with breakfast for 5 days.  ? [DISCONTINUED] albuterol (PROVENTIL HFA) 108 (90 Base) MCG/ACT inhaler INHALE ONE TO TWO PUFFS PO EVERY 6 HOURS PRN FOR WHEEZING  ? [DISCONTINUED] dexamethasone (DECADRON) 6 MG tablet Take 1 tablet (6 mg total) by mouth 2 (two) times daily with a meal.  ? albuterol (PROVENTIL HFA) 108 (90 Base) MCG/ACT inhaler INHALE ONE TO TWO PUFFS PO EVERY 6 HOURS PRN FOR WHEEZING  ? [DISCONTINUED] rosuvastatin (CRESTOR) 5 MG tablet Take 1 on Monday and Thursday evenings for cholesterol  ? ?No facility-administered encounter medications on file  as of 11/07/2021.  ? ? ?Allergies  ?Allergen Reactions  ? Keflex [Cephalexin] Nausea Only  ? Sulfa Antibiotics Hives  ? ? ?Review of Systems  ?Constitutional:  Negative for activity change, appetite change, chills, diaphoresis, fatigue, fever, malaise/fatigue, unexpected weight change and weight loss.  ?HENT:  Positive for congestion. Negative for dental problem, drooling, ear discharge, ear pain,  facial swelling, hearing loss, hoarse voice, mouth sores, nosebleeds, postnasal drip, rhinorrhea, sinus pressure, sinus pain, sneezing, sore throat, tinnitus, trouble swallowing and voice change.   ?Eyes: Negative.   ?Respiratory:  Positive for cough, chest tightness, shortness of breath and wheezing. Negative for apnea, hemoptysis, sputum production, choking and stridor.   ?Cardiovascular:  Positive for dyspnea on exertion. Negative for chest pain, palpitations, leg swelling and PND.  ?Gastrointestinal:  Negative for abdominal pain, blood in stool, constipation, diarrhea, heartburn, nausea and vomiting.  ?Endocrine: Negative.   ?Genitourinary:  Negative for decreased urine volume, difficulty urinating, dysuria, frequency and urgency.  ?Musculoskeletal:  Negative for arthralgias and myalgias.  ?Skin: Negative.   ?Allergic/Immunologic: Negative.   ?Neurological:  Negative for dizziness, tremors, seizures, syncope, facial asymmetry, speech difficulty, weakness, light-headedness, numbness and headaches.  ?Hematological: Negative.   ?Psychiatric/Behavioral:  Negative for confusion, hallucinations, sleep disturbance and suicidal ideas.   ?All other systems reviewed and are negative. ? ?   ? ?Objective:  ?BP 121/83   Pulse 96   Temp 99.3 ?F (37.4 ?C)   Ht 5\' 1"  (1.549 m)   Wt 256 lb 12.8 oz (116.5 kg)   SpO2 98%   BMI 48.52 kg/m?   ? ?Wt Readings from Last 3 Encounters:  ?11/07/21 256 lb 12.8 oz (116.5 kg)  ?01/10/21 250 lb 3.2 oz (113.5 kg)  ?05/06/19 260 lb (117.9 kg)  ? ? ?Physical Exam ?Vitals and nursing note reviewed.  ?Constitutional:   ?   General: She is not in acute distress. ?   Appearance: She is well-developed and well-groomed. She is obese. She is ill-appearing. She is not toxic-appearing or diaphoretic.  ?HENT:  ?   Head: Normocephalic and atraumatic.  ?   Jaw: There is normal jaw occlusion.  ?   Right Ear: Hearing, tympanic membrane, ear canal and external ear normal.  ?   Left Ear: Hearing, tympanic  membrane, ear canal and external ear normal.  ?   Nose: Nose normal.  ?   Mouth/Throat:  ?   Lips: Pink.  ?   Mouth: Mucous membranes are moist.  ?   Pharynx: Oropharynx is clear. Uvula midline.  ?Eyes:  ?   General: Lids are normal.  ?   Extraocular Movements: Extraocular movements intact.  ?   Conjunctiva/sclera: Conjunctivae normal.  ?   Pupils: Pupils are equal, round, and reactive to light.  ?Neck:  ?   Thyroid: No thyroid mass, thyromegaly or thyroid tenderness.  ?   Vascular: No carotid bruit or JVD.  ?   Trachea: Trachea and phonation normal.  ?Cardiovascular:  ?   Rate and Rhythm: Normal rate and regular rhythm.  ?   Chest Wall: PMI is not displaced.  ?   Pulses: Normal pulses.  ?   Heart sounds: Normal heart sounds. No murmur heard. ?  No friction rub. No gallop.  ?Pulmonary:  ?   Effort: Pulmonary effort is normal. No respiratory distress.  ?   Breath sounds: No stridor. Wheezing and rhonchi present. No rales.  ?Chest:  ?   Chest wall: No tenderness.  ?Abdominal:  ?  General: There is no abdominal bruit.  ?   Palpations: There is no hepatomegaly or splenomegaly.  ?Musculoskeletal:     ?   General: Normal range of motion.  ?   Cervical back: Normal range of motion and neck supple.  ?   Right lower leg: No edema.  ?   Left lower leg: No edema.  ?Lymphadenopathy:  ?   Cervical: No cervical adenopathy.  ?Skin: ?   General: Skin is warm and dry.  ?   Capillary Refill: Capillary refill takes less than 2 seconds.  ?   Coloration: Skin is not cyanotic, jaundiced or pale.  ?   Findings: No rash.  ?Neurological:  ?   General: No focal deficit present.  ?   Mental Status: She is alert and oriented to person, place, and time.  ?   Sensory: Sensation is intact.  ?   Motor: Motor function is intact.  ?   Coordination: Coordination is intact.  ?   Gait: Gait is intact.  ?   Deep Tendon Reflexes: Reflexes are normal and symmetric.  ?Psychiatric:     ?   Attention and Perception: Attention and perception normal.     ?    Mood and Affect: Mood and affect normal.     ?   Speech: Speech normal.     ?   Behavior: Behavior normal. Behavior is cooperative.     ?   Thought Content: Thought content normal.     ?   Cognition and Memory:

## 2021-11-14 ENCOUNTER — Other Ambulatory Visit: Payer: Self-pay | Admitting: Family Medicine

## 2021-11-14 DIAGNOSIS — J4541 Moderate persistent asthma with (acute) exacerbation: Secondary | ICD-10-CM

## 2021-11-15 ENCOUNTER — Other Ambulatory Visit: Payer: Self-pay | Admitting: Family Medicine

## 2021-11-15 DIAGNOSIS — J4541 Moderate persistent asthma with (acute) exacerbation: Secondary | ICD-10-CM

## 2022-03-07 ENCOUNTER — Ambulatory Visit: Payer: BC Managed Care – PPO | Admitting: Family Medicine

## 2022-03-07 VITALS — BP 159/103 | HR 95 | Temp 100.7°F | Ht 61.0 in | Wt 257.4 lb

## 2022-03-07 DIAGNOSIS — J0100 Acute maxillary sinusitis, unspecified: Secondary | ICD-10-CM

## 2022-03-07 MED ORDER — AMOXICILLIN-POT CLAVULANATE 875-125 MG PO TABS: 1.0000 | ORAL_TABLET | Freq: Two times a day (BID) | ORAL | 0 refills | Status: DC

## 2022-03-07 NOTE — Addendum Note (Signed)
Addended by: Adella Hare B on: 03/07/2022 01:11 PM   Modules accepted: Orders

## 2022-03-07 NOTE — Addendum Note (Signed)
Addended by: Adella Hare B on: 03/07/2022 01:02 PM   Modules accepted: Orders

## 2022-03-07 NOTE — Progress Notes (Signed)
Chief Complaint  Patient presents with   Nasal Congestion   Chills   Fatigue    HPI  Patient presents today for Symptoms include congestion, right facial pain, nasal congestion, non productive cough, post nasal drip and sinus pressure. There is no fever, chills, or sweats. Onset of symptoms was a few days ago, gradually worsening since that time.    PMH: Smoking status noted ROS: Per HPI  Objective: BP (!) 159/103   Pulse 95   Temp (!) 100.7 F (38.2 C)   Ht 5\' 1"  (1.549 m)   Wt 257 lb 6.4 oz (116.8 kg)   SpO2 99%   BMI 48.64 kg/m  Gen: NAD, alert, cooperative with exam HEENT: NCAT, EOMI, PERRL CV: RRR, good S1/S2, no murmur Resp: CTABL, no wheezes, non-labored Abd: SNTND, BS present, no guarding or organomegaly Ext: No edema, warm Neuro: Alert and oriented, No gross deficits  Assessment and plan:  1. Acute maxillary sinusitis, recurrence not specified     Meds ordered this encounter  Medications   amoxicillin-clavulanate (AUGMENTIN) 875-125 MG tablet    Sig: Take 1 tablet by mouth 2 (two) times daily. Take all of this medication    Dispense:  20 tablet    Refill:  0    No orders of the defined types were placed in this encounter.   Follow up as needed.  , MD

## 2022-03-08 LAB — COVID-19, FLU A+B AND RSV
Influenza A, NAA: NOT DETECTED
Influenza B, NAA: NOT DETECTED
RSV, NAA: NOT DETECTED
SARS-CoV-2, NAA: DETECTED — AB

## 2022-03-11 ENCOUNTER — Telehealth: Payer: Self-pay

## 2022-03-11 NOTE — Telephone Encounter (Signed)
LMTCB

## 2022-03-11 NOTE — Telephone Encounter (Signed)
-----   Message from Mechele Claude, MD sent at 03/10/2022 12:39 PM EDT ----- Regarding: FW:lab Pt. CoVID positive, check her symptoms to see what she needs ----- Message ----- From: Interface, [facility] Lab Results In Sent: 03/08/2022   8:35 PM EDT To: Mechele Claude, MD

## 2022-03-11 NOTE — Telephone Encounter (Signed)
Lmtcb.

## 2022-03-19 NOTE — Telephone Encounter (Signed)
Encounter closed, positive Covid test is 35 days old.

## 2022-03-20 ENCOUNTER — Ambulatory Visit: Payer: BC Managed Care – PPO | Admitting: Family Medicine

## 2022-03-20 ENCOUNTER — Telehealth: Payer: Self-pay | Admitting: Family Medicine

## 2022-03-20 DIAGNOSIS — I10 Essential (primary) hypertension: Secondary | ICD-10-CM

## 2022-03-20 NOTE — Telephone Encounter (Signed)
  Prescription Request  03/20/2022  Is this a "Controlled Substance" medicine? NO  Have you seen your PCP in the last 2 weeks? PT was scheduled for today but Dr. Livia Snellen is out, resch appt for 04/17/2022  If YES, route message to pool  -  If NO, patient needs to be scheduled for appointment.  What is the name of the medication or equipment? lisinopril (ZESTRIL) 10 MG tablet  Have you contacted your pharmacy to request a refill? yes   Which pharmacy would you like this sent to? [facility] [place]    Patient notified that their request is being sent to the clinical staff for review and that they should receive a response within 2 business days.

## 2022-03-21 ENCOUNTER — Other Ambulatory Visit: Payer: Self-pay | Admitting: Family Medicine

## 2022-03-21 DIAGNOSIS — I10 Essential (primary) hypertension: Secondary | ICD-10-CM

## 2022-03-21 MED ORDER — LISINOPRIL 10 MG PO TABS: 10.0000 mg | ORAL_TABLET | Freq: Every day | ORAL | 0 refills | Status: DC

## 2022-03-21 NOTE — Telephone Encounter (Signed)
Pt is scheduled to see Dr Livia Snellen on 04/17/22. Needs refill sent in to last her until her appt.  Dr Livia Snellen, can you advise on this and nurse call pt with update on if refill can be sent or not.

## 2022-03-21 NOTE — Telephone Encounter (Signed)
Please let the patient know that I sent their prescription to their pharmacy. Thanks, WS 

## 2022-04-17 ENCOUNTER — Ambulatory Visit: Payer: BC Managed Care – PPO | Admitting: Family Medicine

## 2022-04-17 ENCOUNTER — Encounter: Payer: Self-pay | Admitting: Family Medicine

## 2022-04-17 VITALS — BP 138/83 | HR 81 | Temp 98.2°F | Ht 61.0 in | Wt 262.0 lb

## 2022-04-17 DIAGNOSIS — H6121 Impacted cerumen, right ear: Secondary | ICD-10-CM | POA: Diagnosis not present

## 2022-04-17 DIAGNOSIS — J4541 Moderate persistent asthma with (acute) exacerbation: Secondary | ICD-10-CM | POA: Diagnosis not present

## 2022-04-17 DIAGNOSIS — E785 Hyperlipidemia, unspecified: Secondary | ICD-10-CM

## 2022-04-17 DIAGNOSIS — I10 Essential (primary) hypertension: Secondary | ICD-10-CM | POA: Diagnosis not present

## 2022-04-17 MED ORDER — MONTELUKAST SODIUM 10 MG PO TABS: 10.0000 mg | ORAL_TABLET | Freq: Every day | ORAL | 3 refills | Status: DC

## 2022-04-17 MED ORDER — LISINOPRIL 10 MG PO TABS: 10.0000 mg | ORAL_TABLET | Freq: Every day | ORAL | 3 refills | Status: DC

## 2022-04-17 MED ORDER — ALBUTEROL SULFATE HFA 108 (90 BASE) MCG/ACT IN AERS: INHALATION_SPRAY | RESPIRATORY_TRACT | 5 refills | Status: DC

## 2022-04-17 NOTE — Progress Notes (Signed)
Subjective:  Patient ID: Tamara Macias, female    DOB: [birth year]  Age: 46 y.o. MRN: [id number]  CC: Hypertension   HPI Tamara Macias presents for  in for follow-up of elevated cholesterol. Doing well without complaints on current medication. Denies side effects of statin including myalgia and arthralgia and nausea. Currently no chest pain, shortness of breath or other cardiovascular related symptoms noted.   presents for  follow-up of hypertension. Patient has no history of headache chest pain or shortness of breath or recent cough. Patient also denies symptoms of TIA such as focal numbness or weakness. Patient denies side effects from medication. States taking it regularly.  Some asthma with hay fever season and cotton dust where she works. Taking singulair PRN     04/17/2022   10:40 AM 03/07/2022   11:34 AM 11/07/2021    3:24 PM  Depression screen PHQ 2/9  Decreased Interest 0 0 0  Down, Depressed, Hopeless 0 0 0  PHQ - 2 Score 0 0 0  Altered sleeping   0  Tired, decreased energy   0  Change in appetite   0  Feeling bad or failure about yourself    0  Trouble concentrating   0  Moving slowly or fidgety/restless   0  Suicidal thoughts   0  PHQ-9 Score   0  Difficult doing work/chores   Not difficult at all    History Tamara Macias has a past medical history of Hyperlipidemia and Hypertension.   She has no past surgical history on file.   Her family history includes COPD in her father; Hypertension in her father.She reports that she has never smoked. She has never used smokeless tobacco. She reports that she does not drink alcohol and does not use drugs.    ROS Review of Systems  HENT:  Positive for ear pain (right, feels like FB in it).   Respiratory:  Positive for shortness of breath (due to hay fever).   Neurological:  Positive for dizziness (really bad after covid, but getting better.).    Objective:  BP 138/83   Pulse 81   Temp 98.2 F (36.8 C)   Ht 5' 1"  (1.549 m)   Wt 262 lb (118.8 kg)   SpO2 96%   BMI 49.50 kg/m   BP Readings from Last 3 Encounters:  04/17/22 138/83  03/07/22 (!) 159/103  11/07/21 121/83    Wt Readings from Last 3 Encounters:  04/17/22 262 lb (118.8 kg)  03/07/22 257 lb 6.4 oz (116.8 kg)  11/07/21 256 lb 12.8 oz (116.5 kg)     Physical Exam Constitutional:      General: She is not in acute distress.    Appearance: She is well-developed.  HENT:     Right Ear: There is impacted cerumen.  Cardiovascular:     Rate and Rhythm: Normal rate and regular rhythm.  Pulmonary:     Breath sounds: Normal breath sounds.  Musculoskeletal:        General: Normal range of motion.  Skin:    General: Skin is warm and dry.  Neurological:     Mental Status: She is alert and oriented to person, place, and time.       Assessment & Plan:   Tamara Macias was seen today for hypertension.  Diagnoses and all orders for this visit:  Essential hypertension -     CBC with Differential/Platelet -     CMP14+EGFR -     lisinopril (ZESTRIL) 10  MG tablet; Take 1 tablet (10 mg total) by mouth daily.  Hyperlipidemia with target LDL less than 100 -     Lipid panel  Moderate persistent asthma with acute exacerbation -     montelukast (SINGULAIR) 10 MG tablet; Take 1 tablet (10 mg total) by mouth at bedtime. -     albuterol (PROVENTIL HFA) 108 (90 Base) MCG/ACT inhaler; INHALE ONE TO TWO PUFFS PO EVERY 6 HOURS PRN FOR WHEEZING  Impacted cerumen of right ear       I have discontinued Osker Mason Kitchen's Albuterol Sulfate (sensor), azithromycin, and amoxicillin-clavulanate. I am also having her maintain her fluticasone, lisinopril, montelukast, and albuterol.  Allergies as of 04/17/2022       Reactions   Keflex [cephalexin] Nausea Only   Sulfa Antibiotics Hives        Medication List        Accurate as of April 17, 2022 11:12 AM. If you have any questions, ask your nurse or doctor.          STOP taking these  medications    Albuterol Sulfate (sensor) 108 (90 Base) MCG/ACT Aepb Stopped by: Claretta Fraise, MD   amoxicillin-clavulanate [phone] MG tablet Commonly known as: AUGMENTIN Stopped by: Claretta Fraise, MD   azithromycin 250 MG tablet Commonly known as: Zithromax Z-Pak Stopped by: Claretta Fraise, MD       TAKE these medications    albuterol 108 (90 Base) MCG/ACT inhaler Commonly known as: Proventil HFA INHALE ONE TO TWO PUFFS PO EVERY 6 HOURS PRN FOR WHEEZING   fluticasone 50 MCG/ACT nasal spray Commonly known as: FLONASE Place 2 sprays into both nostrils daily.   lisinopril 10 MG tablet Commonly known as: ZESTRIL Take 1 tablet (10 mg total) by mouth daily.   montelukast 10 MG tablet Commonly known as: SINGULAIR Take 1 tablet (10 mg total) by mouth at bedtime.       Take singulair daily. Wear mask when expose to dust  Follow-up: Return in about 6 months (around 10/17/2022).  Claretta Fraise, M.D.

## 2022-04-18 LAB — CMP14+EGFR
ALT: 31 IU/L (ref 0–32)
AST: 26 IU/L (ref 0–40)
Albumin/Globulin Ratio: 1.6 (ref 1.2–2.2)
Albumin: 4.1 g/dL (ref 3.9–4.9)
Alkaline Phosphatase: 117 IU/L (ref 44–121)
BUN/Creatinine Ratio: 25 — ABNORMAL HIGH (ref 9–23)
BUN: 13 mg/dL (ref 6–24)
Bilirubin Total: <0.2 mg/dL (ref 0.0–1.2)
CO2: 24 mmol/L (ref 20–29)
Calcium: 9.4 mg/dL (ref 8.7–10.2)
Chloride: 102 mmol/L (ref 96–106)
Creatinine, Ser: 0.53 mg/dL — ABNORMAL LOW (ref 0.57–1.00)
Globulin, Total: 2.5 g/dL (ref 1.5–4.5)
Glucose: 88 mg/dL (ref 70–99)
Potassium: 4.5 mmol/L (ref 3.5–5.2)
Sodium: 137 mmol/L (ref 134–144)
Total Protein: 6.6 g/dL (ref 6.0–8.5)
eGFR: 117 mL/min/{1.73_m2} (ref >59)

## 2022-04-18 LAB — CBC WITH DIFFERENTIAL/PLATELET
Basophils Absolute: 0.1 10*3/uL (ref 0.0–0.2)
Basos: 1 %
EOS (ABSOLUTE): 0.3 10*3/uL (ref 0.0–0.4)
Eos: 3 %
Hematocrit: 45.1 % (ref 34.0–46.6)
Hemoglobin: 14.5 g/dL (ref 11.1–15.9)
Immature Grans (Abs): 0.1 10*3/uL (ref 0.0–0.1)
Immature Granulocytes: 1 %
Lymphocytes Absolute: 2.4 10*3/uL (ref 0.7–3.1)
Lymphs: 22 %
MCH: 26.7 pg (ref 26.6–33.0)
MCHC: 32.2 g/dL (ref 31.5–35.7)
MCV: 83 fL (ref 79–97)
Monocytes Absolute: 0.8 10*3/uL (ref 0.1–0.9)
Monocytes: 7 %
Neutrophils Absolute: 7.3 10*3/uL — ABNORMAL HIGH (ref 1.4–7.0)
Neutrophils: 66 %
Platelets: 286 10*3/uL (ref 150–450)
RBC: 5.43 x10E6/uL — ABNORMAL HIGH (ref 3.77–5.28)
RDW: 15.6 % — ABNORMAL HIGH (ref 11.7–15.4)
WBC: 10.9 10*3/uL — ABNORMAL HIGH (ref 3.4–10.8)

## 2022-04-18 LAB — LIPID PANEL
Chol/HDL Ratio: 6.5 ratio — ABNORMAL HIGH (ref 0.0–4.4)
Cholesterol, Total: 252 mg/dL — ABNORMAL HIGH (ref 100–199)
HDL: 39 mg/dL — ABNORMAL LOW (ref >39)
LDL Chol Calc (NIH): 160 mg/dL — ABNORMAL HIGH (ref 0–99)
Triglycerides: 284 mg/dL — ABNORMAL HIGH (ref 0–149)
VLDL Cholesterol Cal: 53 mg/dL — ABNORMAL HIGH (ref 5–40)

## 2022-04-22 ENCOUNTER — Other Ambulatory Visit: Payer: Self-pay | Admitting: Family Medicine

## 2022-04-22 MED ORDER — NEXLIZET 180-10 MG PO TABS
1.0000 | ORAL_TABLET | Freq: Every day | ORAL | 2 refills | Status: DC
Start: 2022-04-22 — End: 2023-10-28

## 2022-08-21 ENCOUNTER — Ambulatory Visit: Payer: BC Managed Care – PPO | Admitting: Family Medicine

## 2022-08-27 ENCOUNTER — Encounter: Payer: BC Managed Care – PPO | Admitting: Family Medicine

## 2022-09-04 ENCOUNTER — Encounter: Payer: Self-pay | Admitting: Family Medicine

## 2022-09-04 ENCOUNTER — Other Ambulatory Visit (HOSPITAL_COMMUNITY)
Admission: RE | Admit: 2022-09-04 | Discharge: 2022-09-04 | Disposition: A | Discharge status: Home / Self Care | Payer: BC Managed Care – PPO | Source: Ambulatory Visit | Attending: Family Medicine | Admitting: Family Medicine

## 2022-09-04 ENCOUNTER — Ambulatory Visit (INDEPENDENT_AMBULATORY_CARE_PROVIDER_SITE_OTHER): Payer: BC Managed Care – PPO | Admitting: Family Medicine

## 2022-09-04 VITALS — BP 133/73 | HR 73 | Temp 97.7°F | Ht 61.0 in | Wt 264.2 lb

## 2022-09-04 DIAGNOSIS — Z0001 Encounter for general adult medical examination with abnormal findings: Secondary | ICD-10-CM

## 2022-09-04 DIAGNOSIS — Z6841 Body Mass Index (BMI) 40.0 and over, adult: Secondary | ICD-10-CM

## 2022-09-04 DIAGNOSIS — Z124 Encounter for screening for malignant neoplasm of cervix: Secondary | ICD-10-CM | POA: Diagnosis present

## 2022-09-04 DIAGNOSIS — I10 Essential (primary) hypertension: Secondary | ICD-10-CM | POA: Diagnosis not present

## 2022-09-04 DIAGNOSIS — J4520 Mild intermittent asthma, uncomplicated: Secondary | ICD-10-CM

## 2022-09-04 DIAGNOSIS — E785 Hyperlipidemia, unspecified: Secondary | ICD-10-CM | POA: Diagnosis not present

## 2022-09-04 DIAGNOSIS — Z0000 Encounter for general adult medical examination without abnormal findings: Secondary | ICD-10-CM

## 2022-09-04 DIAGNOSIS — E282 Polycystic ovarian syndrome: Secondary | ICD-10-CM | POA: Insufficient documentation

## 2022-09-04 DIAGNOSIS — D509 Iron deficiency anemia, unspecified: Secondary | ICD-10-CM | POA: Diagnosis not present

## 2022-09-04 LAB — URINALYSIS
Bilirubin, UA: NEGATIVE
Glucose, UA: NEGATIVE
Ketones, UA: NEGATIVE
Leukocytes,UA: NEGATIVE
Nitrite, UA: NEGATIVE
RBC, UA: NEGATIVE
Specific Gravity, UA: 1.025 (ref 1.005–1.030)
Urobilinogen, Ur: 0.2 mg/dL (ref 0.2–1.0)
pH, UA: 6.5 (ref 5.0–7.5)

## 2022-09-04 NOTE — Addendum Note (Signed)
Addended by: Baldomero Lamy B on: 09/04/2022 11:05 AM   Modules accepted: Orders

## 2022-09-04 NOTE — Progress Notes (Signed)
Complete physical exam  Patient: Tamara Macias   DOB: 10-18-1975   47 y.o. Female  MRN: [id number]  Subjective:    Chief Complaint  Patient presents with   Annual Exam    Renai Macias is a 47 y.o. female who presents today for a complete physical exam. She reports consuming a general diet. The patient has a physically strenuous job, but has no regular exercise apart from work.  She generally feels fairly well. She reports sleeping fairly well. She does not have additional problems to discuss today.    Most recent fall risk assessment:    09/04/2022    9:16 AM  Fall Risk   Falls in the past year? 0     Most recent depression screenings:    09/04/2022    9:16 AM 04/17/2022   10:40 AM  PHQ 2/9 Scores  PHQ - 2 Score 0 0    Dental: No current dental problems  Patient Active Problem List   Diagnosis Date Noted   PCOS (polycystic ovarian syndrome) 09/04/2022   Iron deficiency anemia 10/02/2015   Morbid obesity with BMI of 45.0-49.9, adult ([facility]) 09/28/2015   Asthma, chronic 11/09/2014   Essential hypertension 10/28/2013   Hyperlipidemia with target LDL less than 100 10/28/2013   Past Medical History:  Diagnosis Date   Hyperlipidemia    Hypertension    History reviewed. No pertinent surgical history. Social History   Socioeconomic History   Marital status: Married    Spouse name: Not on file   Number of children: Not on file   Years of education: Not on file   Highest education level: Not on file  Occupational History   Not on file  Tobacco Use   Smoking status: Never   Smokeless tobacco: Never  Substance and Sexual Activity   Alcohol use: No   Drug use: No   Sexual activity: Yes  Other Topics Concern   Not on file  Social History Narrative   Not on file   Social Determinants of Health   Financial Resource Strain: Not on file  Food Insecurity: Not on file  Transportation Needs: Not on file  Physical Activity: Not on file  Stress: Not on file   Social Connections: Not on file  Intimate Partner Violence: Not on file   Family History  Problem Relation Age of Onset   COPD Father    Hypertension Father    Allergies  Allergen Reactions   Keflex [Cephalexin] Nausea Only   Sulfa Antibiotics Hives      Patient Care Team: Claretta Fraise, MD as PCP - General (Family Medicine)   Outpatient Medications Prior to Visit  Medication Sig   albuterol (PROVENTIL HFA) 108 (90 Base) MCG/ACT inhaler INHALE ONE TO TWO PUFFS PO EVERY 6 HOURS PRN FOR WHEEZING   Bempedoic Acid-Ezetimibe (NEXLIZET) 180-10 MG TABS Take 1 tablet by mouth daily.   fluticasone (FLONASE) 50 MCG/ACT nasal spray Place 2 sprays into both nostrils daily.   lisinopril (ZESTRIL) 10 MG tablet Take 1 tablet (10 mg total) by mouth daily.   montelukast (SINGULAIR) 10 MG tablet Take 1 tablet (10 mg total) by mouth at bedtime.   No facility-administered medications prior to visit.    Review of Systems  Constitutional:  Negative for chills, diaphoresis, fever, malaise/fatigue and weight loss.  HENT:  Negative for congestion, ear pain, hearing loss, nosebleeds, sore throat and tinnitus.   Eyes:  Negative for blurred vision, double vision, photophobia, pain, discharge and redness.  Respiratory:  Negative for cough, hemoptysis, sputum production, shortness of breath and wheezing.   Cardiovascular:  Negative for chest pain, palpitations, orthopnea, leg swelling and PND.  Gastrointestinal:  Negative for abdominal pain, blood in stool, constipation, diarrhea, heartburn, melena, nausea and vomiting.  Genitourinary:  Negative for dysuria, flank pain, frequency, hematuria and urgency.  Musculoskeletal:  Negative for back pain, falls, myalgias and neck pain.  Skin:  Negative for itching and rash.  Neurological:  Negative for dizziness, tingling, tremors, sensory change, speech change, focal weakness, seizures, loss of consciousness, weakness and headaches.  Endo/Heme/Allergies:   Negative for environmental allergies and polydipsia. Does not bruise/bleed easily.  Psychiatric/Behavioral:  Negative for depression, hallucinations, memory loss, substance abuse and suicidal ideas. The patient is not nervous/anxious and does not have insomnia.           Objective:     BP 133/73   Pulse 73   Temp 97.7 F (36.5 C)   Ht '5\' 1"'$  (1.549 m)   Wt 264 lb 3.2 oz (119.8 kg)   SpO2 97%   BMI 49.92 kg/m    Physical Exam Exam conducted with a chaperone present.  Constitutional:      General: She is not in acute distress.    Appearance: Normal appearance. She is well-developed.  HENT:     Head: Normocephalic and atraumatic.     Right Ear: External ear normal.     Left Ear: External ear normal.     Nose: Nose normal.  Eyes:     Conjunctiva/sclera: Conjunctivae normal.     Pupils: Pupils are equal, round, and reactive to light.  Neck:     Thyroid: No thyromegaly.  Cardiovascular:     Rate and Rhythm: Normal rate and regular rhythm.     Heart sounds: Normal heart sounds. No murmur heard. Pulmonary:     Effort: Pulmonary effort is normal. No respiratory distress.     Breath sounds: Normal breath sounds. No wheezing or rales.  Chest:  Breasts:    Breasts are symmetrical.     Right: No inverted nipple, mass or tenderness.     Left: No inverted nipple, mass or tenderness.  Abdominal:     General: Bowel sounds are normal. There is no distension or abdominal bruit.     Palpations: Abdomen is soft. There is no hepatomegaly, splenomegaly or mass.     Tenderness: There is no abdominal tenderness. Negative signs include Murphy's sign and McBurney's sign.  Genitourinary:    General: Normal vulva.     Exam position: Lithotomy position.     Vagina: No vaginal discharge, erythema, tenderness, bleeding or lesions.     Cervix: Normal.  Musculoskeletal:        General: No tenderness. Normal range of motion.     Cervical back: Normal range of motion and neck supple.   Lymphadenopathy:     Cervical: No cervical adenopathy.  Skin:    General: Skin is warm and dry.     Findings: No rash.  Neurological:     Mental Status: She is alert and oriented to person, place, and time.     Deep Tendon Reflexes: Reflexes are normal and symmetric.  Psychiatric:        Behavior: Behavior normal.        Thought Content: Thought content normal.        Judgment: Judgment normal.      No results found for any visits on 09/04/22.     Assessment & Plan:  Routine Health Maintenance and Physical Exam  Immunization History  Administered Date(s) Administered   Tdap 09/28/2015    Health Maintenance  Topic Date Due   PAP SMEAR-Modifier  09/04/2022 (Originally 09/28/2018)   COVID-19 Vaccine (1) 09/20/2022 (Originally 01/22/1983)   INFLUENZA VACCINE  09/29/2022 (Originally 01/29/2022)   Hepatitis C Screening  04/18/2023 (Originally 01/22/1996)   DTaP/Tdap/Td (2 - Td or Tdap) 09/27/2025   HIV Screening  Completed   HPV VACCINES  Aged Out    Discussed health benefits of physical activity, and encouraged her to engage in regular exercise appropriate for her age and condition.  Problem List Items Addressed This Visit       Active Problems   Asthma, chronic   Essential hypertension   Relevant Orders   CMP14+EGFR   Hyperlipidemia with target LDL less than 100   Relevant Orders   Lipid panel   Iron deficiency anemia   Relevant Orders   CBC with Differential/Platelet   Morbid obesity with BMI of 45.0-49.9, adult ([facility])   PCOS (polycystic ovarian syndrome)   Other Visit Diagnoses     Well adult exam    -  Primary   Relevant Orders   CBC with Differential/Platelet   CMP14+EGFR   Lipid panel   VITAMIN D 25 Hydroxy (Vit-D Deficiency, Fractures)   Urinalysis      No follow-ups on file.     Claretta Fraise, MD

## 2022-09-05 LAB — CBC WITH DIFFERENTIAL/PLATELET
Basophils Absolute: 0.1 10*3/uL (ref 0.0–0.2)
Basos: 1 %
EOS (ABSOLUTE): 0.3 10*3/uL (ref 0.0–0.4)
Eos: 2 %
Hematocrit: 45.3 % (ref 34.0–46.6)
Hemoglobin: 14.6 g/dL (ref 11.1–15.9)
Immature Grans (Abs): 0.1 10*3/uL (ref 0.0–0.1)
Immature Granulocytes: 1 %
Lymphocytes Absolute: 2.8 10*3/uL (ref 0.7–3.1)
Lymphs: 24 %
MCH: 26.8 pg (ref 26.6–33.0)
MCHC: 32.2 g/dL (ref 31.5–35.7)
MCV: 83 fL (ref 79–97)
Monocytes Absolute: 0.8 10*3/uL (ref 0.1–0.9)
Monocytes: 7 %
Neutrophils Absolute: 7.6 10*3/uL — ABNORMAL HIGH (ref 1.4–7.0)
Neutrophils: 65 %
Platelets: 291 10*3/uL (ref 150–450)
RBC: 5.44 x10E6/uL — ABNORMAL HIGH (ref 3.77–5.28)
RDW: 15.2 % (ref 11.7–15.4)
WBC: 11.7 10*3/uL — ABNORMAL HIGH (ref 3.4–10.8)

## 2022-09-05 LAB — CMP14+EGFR
ALT: 24 IU/L (ref 0–32)
AST: 24 IU/L (ref 0–40)
Albumin/Globulin Ratio: 1.6 (ref 1.2–2.2)
Albumin: 4.2 g/dL (ref 3.9–4.9)
Alkaline Phosphatase: 120 IU/L (ref 44–121)
BUN/Creatinine Ratio: 21 (ref 9–23)
BUN: 12 mg/dL (ref 6–24)
Bilirubin Total: 0.3 mg/dL (ref 0.0–1.2)
CO2: 22 mmol/L (ref 20–29)
Calcium: 9.6 mg/dL (ref 8.7–10.2)
Chloride: 100 mmol/L (ref 96–106)
Creatinine, Ser: 0.56 mg/dL — ABNORMAL LOW (ref 0.57–1.00)
Globulin, Total: 2.6 g/dL (ref 1.5–4.5)
Glucose: 85 mg/dL (ref 70–99)
Potassium: 4.5 mmol/L (ref 3.5–5.2)
Sodium: 137 mmol/L (ref 134–144)
Total Protein: 6.8 g/dL (ref 6.0–8.5)
eGFR: 115 mL/min/{1.73_m2} (ref >59)

## 2022-09-05 LAB — LIPID PANEL
Chol/HDL Ratio: 5.2 ratio — ABNORMAL HIGH (ref 0.0–4.4)
Cholesterol, Total: 251 mg/dL — ABNORMAL HIGH (ref 100–199)
HDL: 48 mg/dL (ref >39)
LDL Chol Calc (NIH): 165 mg/dL — ABNORMAL HIGH (ref 0–99)
Triglycerides: 207 mg/dL — ABNORMAL HIGH (ref 0–149)
VLDL Cholesterol Cal: 38 mg/dL (ref 5–40)

## 2022-09-05 LAB — VITAMIN D 25 HYDROXY (VIT D DEFICIENCY, FRACTURES): Vit D, 25-Hydroxy: 13.8 ng/mL — ABNORMAL LOW (ref 30.0–100.0)

## 2022-09-05 NOTE — Progress Notes (Signed)
Patient r/c

## 2022-09-11 LAB — CYTOLOGY - PAP
Comment: NEGATIVE
Diagnosis: UNDETERMINED — AB
High risk HPV: NEGATIVE

## 2022-09-19 ENCOUNTER — Other Ambulatory Visit: Payer: Self-pay | Admitting: Family Medicine

## 2022-09-19 DIAGNOSIS — R87620 Atypical squamous cells of undetermined significance on cytologic smear of vagina (ASC-US): Secondary | ICD-10-CM

## 2022-10-16 ENCOUNTER — Other Ambulatory Visit: Payer: Self-pay | Admitting: Family Medicine

## 2022-10-16 ENCOUNTER — Telehealth: Payer: Self-pay | Admitting: *Deleted

## 2022-10-16 MED ORDER — SPACER/AERO-HOLDING CHAMBERS DEVI: 99 refills | Status: AC

## 2022-10-16 NOTE — Telephone Encounter (Signed)
Fax from [facility] RE: albuterol HFA 90 mcg Note from pharmacy: pt requesting spacer for INH Please advise

## 2022-10-16 NOTE — Telephone Encounter (Signed)
Please let the patient know that I sent their prescription to their pharmacy. Thanks, WS 

## 2023-05-01 ENCOUNTER — Ambulatory Visit: Payer: BC Managed Care – PPO | Admitting: Family Medicine

## 2023-05-01 ENCOUNTER — Encounter: Payer: Self-pay | Admitting: Family Medicine

## 2023-05-01 VITALS — BP 127/71 | HR 83 | Temp 97.8°F | Ht 61.0 in | Wt 261.8 lb

## 2023-05-01 DIAGNOSIS — R062 Wheezing: Secondary | ICD-10-CM

## 2023-05-01 DIAGNOSIS — J0140 Acute pansinusitis, unspecified: Secondary | ICD-10-CM | POA: Diagnosis not present

## 2023-05-01 MED ORDER — PREDNISONE 20 MG PO TABS: 40.0000 mg | ORAL_TABLET | Freq: Every day | ORAL | 0 refills | Status: AC

## 2023-05-01 MED ORDER — AMOXICILLIN-POT CLAVULANATE 875-125 MG PO TABS: 1.0000 | ORAL_TABLET | Freq: Two times a day (BID) | ORAL | 0 refills | Status: AC

## 2023-05-01 NOTE — Progress Notes (Signed)
Subjective:  Patient ID: Tamara Macias, female    DOB: 1975-11-12, 47 y.o.   MRN: [id number]  Patient Care Team: Mechele Claude, MD as PCP - General (Family Medicine)   Chief Complaint:  Nasal Congestion, Cough, and Headache (X 5 days )   HPI: Tamara Macias is a 47 y.o. female presenting on 05/01/2023 for Nasal Congestion, Cough, and Headache (X 5 days )   Discussed the use of AI scribe software for clinical note transcription with the patient, who gave verbal consent to proceed.  History of Present Illness   Tamara Macias, a patient with a history of asthma and allergies, presents with symptoms suggestive of a sinus infection. The patient reports being unwell for approximately five to six days, with a primary complaint of severe nasal congestion. Accompanying symptoms include a sore throat, which has since resolved, and postnasal drainage, causing discomfort in the back of the throat. The patient describes the throat as being 'super sensitive and swollen.' Intermittent fevers have also been reported during this period.  The patient has been managing symptoms at home with Singulair and albuterol, medications prescribed for their asthma and allergies. However, the patient admits to not taking Singulair regularly as recommended, only using it when symptoms become 'really, really bad.' The patient also works in a [employer], which they acknowledge exacerbates their respiratory symptoms.  In addition to the above, the patient has not been using Flonase for sinus relief. Despite these measures, the patient's symptoms have persisted for over a week, prompting the current consultation.          Relevant past medical, surgical, family, and social history reviewed and updated as indicated.  Allergies and medications reviewed and updated. Data reviewed: Chart in Epic.   Past Medical History:  Diagnosis Date   Hyperlipidemia    Hypertension     History reviewed. No pertinent  surgical history.  Social History   Socioeconomic History   Marital status: Married    Spouse name: Not on file   Number of children: Not on file   Years of education: Not on file   Highest education level: Not on file  Occupational History   Not on file  Tobacco Use   Smoking status: Never   Smokeless tobacco: Never  Substance and Sexual Activity   Alcohol use: No   Drug use: No   Sexual activity: Yes  Other Topics Concern   Not on file  Social History Narrative   Not on file   Social Determinants of Health   Financial Resource Strain: Not on file  Food Insecurity: Not on file  Transportation Needs: Not on file  Physical Activity: Not on file  Stress: Not on file  Social Connections: Not on file  Intimate Partner Violence: Not on file    Outpatient Encounter Medications as of 05/01/2023  Medication Sig   albuterol (PROVENTIL HFA) 108 (90 Base) MCG/ACT inhaler INHALE ONE TO TWO PUFFS PO EVERY 6 HOURS PRN FOR WHEEZING   amoxicillin-clavulanate (AUGMENTIN) 875-125 MG tablet Take 1 tablet by mouth 2 (two) times daily for 7 days.   Bempedoic Acid-Ezetimibe (NEXLIZET) 180-10 MG TABS Take 1 tablet by mouth daily.   fluticasone (FLONASE) 50 MCG/ACT nasal spray Place 2 sprays into both nostrils daily.   lisinopril (ZESTRIL) 10 MG tablet Take 1 tablet (10 mg total) by mouth daily.   montelukast (SINGULAIR) 10 MG tablet Take 1 tablet (10 mg total) by mouth at bedtime.   predniSONE (DELTASONE)  20 MG tablet Take 2 tablets (40 mg total) by mouth daily with breakfast for 5 days.   Spacer/Aero-Holding Rudean Curt Use with inhaler Dx: J45.909   No facility-administered encounter medications on file as of 05/01/2023.    Allergies  Allergen Reactions   Keflex [Cephalexin] Nausea Only   Sulfa Antibiotics Hives    Pertinent ROS per HPI, otherwise unremarkable      Objective:  BP 127/71   Pulse 83   Temp 97.8 F (36.6 C) (Temporal)   Ht 5\' 1"  (1.549 m)   Wt 261 lb 12.8  oz (118.8 kg)   SpO2 98%   BMI 49.47 kg/m    Wt Readings from Last 3 Encounters:  05/01/23 261 lb 12.8 oz (118.8 kg)  09/04/22 264 lb 3.2 oz (119.8 kg)  04/17/22 262 lb (118.8 kg)    Physical Exam Vitals and nursing note reviewed.  Constitutional:      General: She is not in acute distress.    Appearance: Normal appearance. She is well-developed and well-groomed. She is obese. She is not ill-appearing, toxic-appearing or diaphoretic.  HENT:     Head: Normocephalic and atraumatic.     Jaw: There is normal jaw occlusion.     Right Ear: Hearing, ear canal and external ear normal. A middle ear effusion is present.     Left Ear: Hearing, ear canal and external ear normal. A middle ear effusion is present.     Nose: Congestion and rhinorrhea present.     Right Turbinates: Enlarged.     Left Turbinates: Enlarged.     Right Sinus: Maxillary sinus tenderness and frontal sinus tenderness present.     Left Sinus: Maxillary sinus tenderness and frontal sinus tenderness present.     Mouth/Throat:     Lips: Pink.     Mouth: Mucous membranes are moist.     Pharynx: Uvula midline. Pharyngeal swelling, posterior oropharyngeal erythema and postnasal drip present.  Eyes:     General: Lids are normal.     Extraocular Movements: Extraocular movements intact.     Conjunctiva/sclera: Conjunctivae normal.     Pupils: Pupils are equal, round, and reactive to light.  Neck:     Thyroid: No thyroid mass, thyromegaly or thyroid tenderness.     Vascular: No carotid bruit or JVD.     Trachea: Trachea and phonation normal.  Cardiovascular:     Rate and Rhythm: Normal rate and regular rhythm.     Chest Wall: PMI is not displaced.     Pulses: Normal pulses.     Heart sounds: Normal heart sounds. No murmur heard.    No friction rub. No gallop.  Pulmonary:     Effort: Pulmonary effort is normal. No respiratory distress.     Breath sounds: Wheezing present.  Abdominal:     Palpations: There is no  hepatomegaly.  Musculoskeletal:        General: Normal range of motion.     Cervical back: Normal range of motion and neck supple.     Right lower leg: No edema.     Left lower leg: No edema.  Lymphadenopathy:     Cervical: Cervical adenopathy present.  Skin:    General: Skin is warm and dry.     Capillary Refill: Capillary refill takes less than 2 seconds.     Coloration: Skin is not cyanotic, jaundiced or pale.     Findings: No rash.  Neurological:     General: No focal deficit present.  Mental Status: She is alert and oriented to person, place, and time.     Sensory: Sensation is intact.     Motor: Motor function is intact.     Coordination: Coordination is intact.     Gait: Gait is intact.     Deep Tendon Reflexes: Reflexes are normal and symmetric.  Psychiatric:        Attention and Perception: Attention and perception normal.        Mood and Affect: Mood and affect normal.        Speech: Speech normal.        Behavior: Behavior normal. Behavior is cooperative.        Thought Content: Thought content normal.        Cognition and Memory: Cognition and memory normal.        Judgment: Judgment normal.    Physical Exam   HEENT: Significant sinus tenderness and drainage. Throat erythematous with postnasal drainage. CHEST: Faint wheezing upon auscultation.        Results for orders placed or performed in visit on 09/04/22  CBC with Differential/Platelet  Result Value Ref Range   WBC 11.7 (H) 3.4 - 10.8 x10E3/uL   RBC 5.44 (H) 3.77 - 5.28 x10E6/uL   Hemoglobin 14.6 11.1 - 15.9 g/dL   Hematocrit 16.1 09.6 - 46.6 %   MCV 83 79 - 97 fL   MCH 26.8 26.6 - 33.0 pg   MCHC 32.2 31.5 - 35.7 g/dL   RDW 04.5 40.9 - 81.1 %   Platelets 291 150 - 450 x10E3/uL   Neutrophils 65 Not Estab. %   Lymphs 24 Not Estab. %   Monocytes 7 Not Estab. %   Eos 2 Not Estab. %   Basos 1 Not Estab. %   Neutrophils Absolute 7.6 (H) 1.4 - 7.0 x10E3/uL   Lymphocytes Absolute 2.8 0.7 - 3.1  x10E3/uL   Monocytes Absolute 0.8 0.1 - 0.9 x10E3/uL   EOS (ABSOLUTE) 0.3 0.0 - 0.4 x10E3/uL   Basophils Absolute 0.1 0.0 - 0.2 x10E3/uL   Immature Granulocytes 1 Not Estab. %   Immature Grans (Abs) 0.1 0.0 - 0.1 x10E3/uL  CMP14+EGFR  Result Value Ref Range   Glucose 85 70 - 99 mg/dL   BUN 12 6 - 24 mg/dL   Creatinine, Ser 9.14 (L) 0.57 - 1.00 mg/dL   eGFR 782 >95 AO/ZHY/8.65   BUN/Creatinine Ratio 21 9 - 23   Sodium 137 134 - 144 mmol/L   Potassium 4.5 3.5 - 5.2 mmol/L   Chloride 100 96 - 106 mmol/L   CO2 22 20 - 29 mmol/L   Calcium 9.6 8.7 - 10.2 mg/dL   Total Protein 6.8 6.0 - 8.5 g/dL   Albumin 4.2 3.9 - 4.9 g/dL   Globulin, Total 2.6 1.5 - 4.5 g/dL   Albumin/Globulin Ratio 1.6 1.2 - 2.2   Bilirubin Total 0.3 0.0 - 1.2 mg/dL   Alkaline Phosphatase 120 44 - 121 IU/L   AST 24 0 - 40 IU/L   ALT 24 0 - 32 IU/L  Lipid panel  Result Value Ref Range   Cholesterol, Total 251 (H) 100 - 199 mg/dL   Triglycerides 784 (H) 0 - 149 mg/dL   HDL 48 >69 mg/dL   VLDL Cholesterol Cal 38 5 - 40 mg/dL   LDL Chol Calc (NIH) 629 (H) 0 - 99 mg/dL   Chol/HDL Ratio 5.2 (H) 0.0 - 4.4 ratio  VITAMIN D 25 Hydroxy (Vit-D Deficiency, Fractures)  Result Value  Ref Range   Vit D, 25-Hydroxy 13.8 (L) 30.0 - 100.0 ng/mL  Urinalysis  Result Value Ref Range   Specific Gravity, UA 1.025 1.005 - 1.030   pH, UA 6.5 5.0 - 7.5   Color, UA Yellow Yellow   Appearance Ur Clear Clear   Leukocytes,UA Negative Negative   Protein,UA Trace (A) Negative/Trace   Glucose, UA Negative Negative   Ketones, UA Negative Negative   RBC, UA Negative Negative   Bilirubin, UA Negative Negative   Urobilinogen, Ur 0.2 0.2 - 1.0 mg/dL   Nitrite, UA Negative Negative  Cytology - PAP([facility])  Result Value Ref Range   High risk HPV Negative    Adequacy      Satisfactory for evaluation; transformation zone component PRESENT.   Diagnosis (A)     - Atypical squamous cells of undetermined significance (ASC-US)   Comment  Normal Reference Range HPV - Negative        Pertinent labs & imaging results that were available during my care of the patient were reviewed by me and considered in my medical decision making.  Assessment & Plan:  Jamillah was seen today for nasal congestion, cough and headache.  Diagnoses and all orders for this visit:  Acute non-recurrent pansinusitis -     amoxicillin-clavulanate (AUGMENTIN) 875-125 MG tablet; Take 1 tablet by mouth 2 (two) times daily for 7 days. -     predniSONE (DELTASONE) 20 MG tablet; Take 2 tablets (40 mg total) by mouth daily with breakfast for 5 days.  Wheezing -     predniSONE (DELTASONE) 20 MG tablet; Take 2 tablets (40 mg total) by mouth daily with breakfast for 5 days.     Assessment and Plan    Sinusitis Symptoms of nasal congestion, sore throat, and fever for approximately 5-6 days. Examination reveals significant nasal drainage and tender sinuses. -Start Augmentin for antibiotic therapy. -Continue Flonase as prescribed. -Encourage increased water intake to aid in clearing nasal drainage.  Asthma Faint wheezing noted on examination. Patient has been inconsistently using Singulair, a long-acting asthma medication. -Advise patient to take Singulair nightly as a preventative measure. -Start a short course of Prednisone for current wheezing.  Allergic Rhinitis Chronic condition, patient works in a [employer] scientific [employer] which exacerbates symptoms. Patient has been inconsistently using Singulair and Flonase. -Advise patient to take Singulair nightly and Flonase every morning as preventative measures.  Follow-up If symptoms worsen or new symptoms develop, patient should contact the office.          Continue all other maintenance medications.  Follow up plan: Return if symptoms worsen or fail to improve.   Continue healthy lifestyle choices, including diet (rich in fruits, vegetables, and lean proteins, and low in salt and simple carbohydrates) and  exercise (at least 30 minutes of moderate physical activity daily).  Educational handout given for sinus infection   The above assessment and management plan was discussed with the patient. The patient verbalized understanding of and has agreed to the management plan. Patient is aware to call the clinic if they develop any new symptoms or if symptoms persist or worsen. Patient is aware when to return to the clinic for a follow-up visit. Patient educated on when it is appropriate to go to the emergency department.   Kari Baars, FNP-C [facility] [phone]

## 2023-05-28 ENCOUNTER — Other Ambulatory Visit (INDEPENDENT_AMBULATORY_CARE_PROVIDER_SITE_OTHER): Payer: BC Managed Care – PPO

## 2023-05-28 ENCOUNTER — Ambulatory Visit: Payer: BC Managed Care – PPO | Admitting: Orthopaedic Surgery

## 2023-05-28 ENCOUNTER — Encounter: Payer: Self-pay | Admitting: Orthopaedic Surgery

## 2023-05-28 VITALS — Ht 61.0 in | Wt 260.0 lb

## 2023-05-28 DIAGNOSIS — Z6841 Body Mass Index (BMI) 40.0 and over, adult: Secondary | ICD-10-CM | POA: Diagnosis not present

## 2023-05-28 DIAGNOSIS — G8929 Other chronic pain: Secondary | ICD-10-CM

## 2023-05-28 DIAGNOSIS — M545 Low back pain, unspecified: Secondary | ICD-10-CM

## 2023-05-28 NOTE — Progress Notes (Signed)
Office Visit Note   Patient: Tamara Macias           Date of Birth: 1975/11/21           MRN: [id number] Visit Date: 05/28/2023              Requested by: Mechele Claude, MD [address] [place],  [place] [address] PCP: Mechele Claude, MD   Assessment & Plan: Visit Diagnoses:  1. Chronic bilateral low back pain, unspecified whether sciatica present   2. Body mass index 45.0-49.9, adult (HCC)     Plan: Patient's history consistent with intermittent disc bulging.  Currently she is doing much better she needs to work on weight loss walking 2 miles a day she has increased symptoms and requires imaging studies for additional treatment.  She agrees to work on this and will follow-up on an as-needed basis.The patient meets the AMA guidelines for Morbid (severe) obesity with a BMI > 40.0 and I have recommended weight loss.   Follow-Up Instructions: No follow-ups on file.   Orders:  Orders Placed This Encounter  Procedures   XR Lumbar Spine 2-3 Views   No orders of the defined types were placed in this encounter.     Procedures: No procedures performed   Clinical Data: No additional findings.   Subjective: Chief Complaint  Patient presents with   Lower Back - Pain    HPI 47 year old female referred by urgent care with intermittent episodic low back pain recently very severe with back pain left buttock pain and occasionally it radiated to her foot she states it is already better she is walking well.  BMI 49.  She is back at work and states flares sometimes depending on her activity level.  Use intermittent ice and Aleve.  Patient is here with her mother.  Review of Systems positive for hypertension morbid obesity.   Objective: Vital Signs: Ht 5\' 1"  (1.549 m)   Wt 260 lb (117.9 kg)   BMI 49.13 kg/m   Physical Exam Constitutional:      Appearance: She is well-developed.  HENT:     Head: Normocephalic.     Right Ear: External ear normal.     Left Ear: External ear  normal. There is no impacted cerumen.  Eyes:     Pupils: Pupils are equal, round, and reactive to light.  Neck:     Thyroid: No thyromegaly.     Trachea: No tracheal deviation.  Cardiovascular:     Rate and Rhythm: Normal rate.  Pulmonary:     Effort: Pulmonary effort is normal.  Abdominal:     Palpations: Abdomen is soft.  Musculoskeletal:     Cervical back: No rigidity.  Skin:    General: Skin is warm and dry.  Neurological:     Mental Status: She is alert and oriented to person, place, and time.  Psychiatric:        Behavior: Behavior normal.     Ortho Exam normal short stride gait she gets sitting standing easily negative straight leg raising.  Some tenderness over the lumbar spine.  No pain with bending or erect position.  Specialty Comments:  No specialty comments available.  Imaging: XR Lumbar Spine 2-3 Views  Result Date: 05/28/2023 2 view lumbar spine demonstrates minimal lumbar curvature.  Anterior spurring L3-4 L4-5 with disc space narrowing more pronounced L5-S1.  Some facet arthropathy. Impression: Lumbar disc degeneration with some spurring and disc space narrowing multiple levels.    PMFS History:  Patient Active Problem List   Diagnosis Date Noted   Low back pain 05/28/2023   Atypical squamous cell changes of undetermined significance (ASCUS) on vaginal cytology 09/19/2022   PCOS (polycystic ovarian syndrome) 09/04/2022   Iron deficiency anemia 10/02/2015   Morbid obesity with BMI of 45.0-49.9, adult (HCC) 09/28/2015   Asthma, chronic 11/09/2014   Essential hypertension 10/28/2013   Hyperlipidemia with target LDL less than 100 10/28/2013   Past Medical History:  Diagnosis Date   Hyperlipidemia    Hypertension     Family History  Problem Relation Age of Onset   COPD Father    Hypertension Father     No past surgical history on file. Social History   Occupational History   Not on file  Tobacco Use   Smoking status: Never   Smokeless  tobacco: Never  Substance and Sexual Activity   Alcohol use: No   Drug use: No   Sexual activity: Yes

## 2023-06-02 ENCOUNTER — Other Ambulatory Visit: Payer: Self-pay | Admitting: Family Medicine

## 2023-06-02 DIAGNOSIS — J4541 Moderate persistent asthma with (acute) exacerbation: Secondary | ICD-10-CM

## 2023-06-02 DIAGNOSIS — I10 Essential (primary) hypertension: Secondary | ICD-10-CM

## 2023-08-25 ENCOUNTER — Ambulatory Visit: Payer: Self-pay | Admitting: Family Medicine

## 2023-08-25 NOTE — Telephone Encounter (Signed)
 This RN made second attempt to triage patient. No answer, left a message. Will route for continued attempts.   Summary: Allergic Reaction   Copied From CRM [id number]. Reason for Triage: Patient thinks they may be having an allergic reaction. Red, burning eyes with slimy stuff coming out. Patient has been washing eyes with baby soap and has congestion. She also has asthma but is not having any breathing problems.

## 2023-08-25 NOTE — Telephone Encounter (Signed)
 Summary: Allergic Reaction   Copied From CRM [id number]. Reason for Triage: Patient thinks they may be having an allergic reaction. Red, burning eyes with slimy stuff coming out. Patient has been washing eyes with baby soap and has congestion. She also has asthma but is not having any breathing problems.          Chief Complaint: Cough; eye drainage Symptoms: cough, sinus headache, eyes discharge Frequency: constant Pertinent Negatives: Patient denies fever Disposition: [] ED /[] Urgent Care (no appt availability in office) / [x] Appointment(In office/virtual)/ []  [facility]/ [] Home Care/ [] Refused Recommended Disposition /[] [facility]/ []  Follow-up with PCP Additional Notes: Patient reports cough and sinus headache x 3 days, also reports discharge from eyes as well. Unsure of cause, thought she was having an allergic reaction to pet danders because her eyes are also red and irritated. Appt scheduled for tomorrow at 8:50am.    Reason for Disposition  [1] Continuous (nonstop) coughing interferes with work or school AND [2] no improvement using cough treatment per Care Advice  Yellow or green pus occurs  Answer Assessment - Initial Assessment Questions 1. ONSET: "When did the cough begin?"      Started 3 days ago  2. SEVERITY: "How bad is the cough today?"      Occasional   3. SPUTUM: "Describe the color of your sputum" (none, dry cough; clear, white, yellow, green)     Dark green  4. HEMOPTYSIS: "Are you coughing up any blood?" If so ask: "How much?" (flecks, streaks, tablespoons, etc.)     No  5. DIFFICULTY BREATHING: "Are you having difficulty breathing?" If Yes, ask: "How bad is it?" (e.g., mild, moderate, severe)    - MILD: No SOB at rest, mild SOB with walking, speaks normally in sentences, can lie down, no retractions, pulse < 100.    - MODERATE: SOB at rest, SOB with minimal exertion and prefers to sit, cannot lie down flat, speaks in phrases, mild  retractions, audible wheezing, pulse 100-120.    - SEVERE: Very SOB at rest, speaks in single words, struggling to breathe, sitting hunched forward, retractions, pulse > 120      No  6. FEVER: "Do you have a fever?" If Yes, ask: "What is your temperature, how was it measured, and when did it start?"     No  7. CARDIAC HISTORY: "Do you have any history of heart disease?" (e.g., heart attack, congestive heart failure)      *No Answer* 8. LUNG HISTORY: "Do you have any history of lung disease?"  (e.g., pulmonary embolus, asthma, emphysema)     Asthma  9. PE RISK FACTORS: "Do you have a history of blood clots?" (or: recent major surgery, recent prolonged travel, bedridden)     *No Answer* 10. OTHER SYMPTOMS: "Do you have any other symptoms?" (e.g., runny nose, wheezing, chest pain)       Nose bleed, eyes reddened and crusty over, sinus headache  11. PREGNANCY: "Is there any chance you are pregnant?" "When was your last menstrual period?"       *No Answer* 12. TRAVEL: "Have you traveled out of the country in the last month?" (e.g., travel history, exposures)       *No Answer*  Answer Assessment - Initial Assessment Questions 1. ONSET: "When did the pain start?" (e.g., minutes, hours, days)     Strted 3 days ago  2. TIMING: "Does the pain come and go, or has it been constant since it started?" (e.g., constant,  intermittent, fleeting)     Constant  3. SEVERITY: "How bad is the pain?"   (Scale 1-10; mild, moderate or severe)   - MILD (1-3): doesn't interfere with normal activities    - MODERATE (4-7): interferes with normal activities or awakens from sleep    - SEVERE (8-10): excruciating pain and patient unable to do normal activities     Mild  4. LOCATION: "Where does it hurt?"  (e.g., eyelid, eye, cheekbone)     Both eyes, right side worse than other  5. CAUSE: "What do you think is causing the pain?"     Unsure of cause  6. VISION: "Do you have blurred vision or changes in your  vision?"      No changes in vision  7. EYE DISCHARGE: "Is there any discharge (pus) from the eye(s)?"  If Yes, ask: "What color is it?"      "Mucus" like discharge from eye  8. FEVER: "Do you have a fever?" If Yes, ask: "What is it, how was it measured, and when did it start?"      No efever  9. OTHER SYMPTOMS: "Do you have any other symptoms?" (e.g., headache, nasal discharge, facial rash)     Headache, cough  10. PREGNANCY: "Is there any chance you are pregnant?" "When was your last menstrual period?"       NO; LMP years ago  Protocols used: Cough - Acute Non-Productive-A-AH, Eye Pain and Other Symptoms-A-AH

## 2023-08-25 NOTE — Telephone Encounter (Signed)
 Summary: Allergic Reaction   Copied From CRM [id number]. Reason for Triage: Patient thinks they may be having an allergic reaction. Red, burning eyes with slimy stuff coming out. Patient has been washing eyes with baby soap and has congestion. She also has asthma but is not having any breathing problems.

## 2023-08-26 ENCOUNTER — Ambulatory Visit: Payer: BC Managed Care – PPO | Admitting: Nurse Practitioner

## 2023-08-26 ENCOUNTER — Ambulatory Visit: Payer: BC Managed Care – PPO

## 2023-08-26 VITALS — BP 146/78 | HR 92 | Temp 97.9°F | Ht 61.0 in | Wt 262.2 lb

## 2023-08-26 DIAGNOSIS — B9689 Other specified bacterial agents as the cause of diseases classified elsewhere: Secondary | ICD-10-CM | POA: Diagnosis not present

## 2023-08-26 DIAGNOSIS — H109 Unspecified conjunctivitis: Secondary | ICD-10-CM | POA: Diagnosis not present

## 2023-08-26 DIAGNOSIS — R062 Wheezing: Secondary | ICD-10-CM | POA: Diagnosis not present

## 2023-08-26 DIAGNOSIS — J069 Acute upper respiratory infection, unspecified: Secondary | ICD-10-CM | POA: Diagnosis not present

## 2023-08-26 MED ORDER — PREDNISONE 20 MG PO TABS: 40.0000 mg | ORAL_TABLET | Freq: Every day | ORAL | 0 refills | Status: AC

## 2023-08-26 MED ORDER — AZITHROMYCIN 500 MG PO TABS: 500.0000 mg | ORAL_TABLET | Freq: Every day | ORAL | 0 refills | Status: AC

## 2023-08-26 MED ORDER — POLYMYXIN B-TRIMETHOPRIM 10000-0.1 UNIT/ML-% OP SOLN: 1.0000 [drp] | Freq: Four times a day (QID) | OPHTHALMIC | 0 refills | Status: AC

## 2023-08-26 NOTE — Progress Notes (Signed)
 Subjective:  Patient ID: Tamara Macias, female    DOB: 1976/06/26, 48 y.o.   MRN: [id number]  Patient Care Team: Mechele Claude, MD as PCP - General (Family Medicine)   Chief Complaint:  Conjunctivitis (Bilateral red eyes, burning and draining x 3 days ) and Nasal Congestion (X 5 days )   HPI: Tamara Macias is a 48 y.o. female presenting on 08/26/2023 for Conjunctivitis (Bilateral red eyes, burning and draining x 3 days ) and Nasal Congestion (X 5 days )   Discussed the use of AI scribe software for clinical note transcription with the patient, who gave verbal consent to proceed.  History of Present Illness   Tamara Macias is a 48 year old female who presents with burning eyes and nasal congestion.  She experiences burning eyes, which are less severe today compared to previous days. There is no discharge, but her eyes remain red. She has been washing her eyes with baby shampoo due to her work environment, where particles like cotton can fall into her eyes. Her husband also experienced similar symptoms, which improved after washing the covers and using baby shampoo. She recalls being prescribed antihistamine eye drops in the past, specifically Optivar, which she used to keep in the refrigerator.  She reports nasal congestion and dark nasal discharge. She has experienced cold chills and has been around others who are sick. She has been taking Nyquil and Sudafed for over a week to manage her symptoms, which include a sore throat that was severe initially but has since improved. No ear pain is noted, but there is a sensation in the back of her throat.  She has a history of asthma, which tends to worsen when she is sick, leading to mild wheezing. She has been prescribed new medications in the past for her asthma.          Relevant past medical, surgical, family, and social history reviewed and updated as indicated.  Allergies and medications reviewed and updated. Data  reviewed: Chart in Epic.   Past Medical History:  Diagnosis Date   Hyperlipidemia    Hypertension     History reviewed. No pertinent surgical history.  Social History   Socioeconomic History   Marital status: Married    Spouse name: Not on file   Number of children: Not on file   Years of education: Not on file   Highest education level: Not on file  Occupational History   Not on file  Tobacco Use   Smoking status: Never   Smokeless tobacco: Never  Substance and Sexual Activity   Alcohol use: No   Drug use: No   Sexual activity: Yes  Other Topics Concern   Not on file  Social History Narrative   Not on file   Social Drivers of Health   Financial Resource Strain: Not on file  Food Insecurity: Not on file  Transportation Needs: Not on file  Physical Activity: Not on file  Stress: Not on file  Social Connections: Not on file  Intimate Partner Violence: Not on file    Outpatient Encounter Medications as of 08/26/2023  Medication Sig   albuterol (VENTOLIN HFA) 108 (90 Base) MCG/ACT inhaler INHALE 1 TO 2 PUFFS BY MOUTH EVERY 6 HOURS AS NEEDED FOR WHEEZING **NEEDS TO BE SEEN BEFORE NEXT REFILL**   azithromycin (ZITHROMAX) 500 MG tablet Take 1 tablet (500 mg total) by mouth daily for 3 days.   Bempedoic Acid-Ezetimibe (NEXLIZET) 180-10 MG TABS Take  1 tablet by mouth daily.   fluticasone (FLONASE) 50 MCG/ACT nasal spray Place 2 sprays into both nostrils daily.   lisinopril (ZESTRIL) 10 MG tablet Take 1 tablet (10 mg total) by mouth daily. **NEEDS TO BE SEEN BEFORE NEXT REFILL**   montelukast (SINGULAIR) 10 MG tablet TAKE 1 TABLET BY MOUTH AT BEDTIME   predniSONE (DELTASONE) 20 MG tablet Take 2 tablets (40 mg total) by mouth daily with breakfast for 5 days.   Spacer/Aero-Holding Rudean Curt Use with inhaler Dx: J45.909   trimethoprim-polymyxin b (POLYTRIM) ophthalmic solution Place 1 drop into both eyes in the morning, at noon, in the evening, and at bedtime for 7 days.    No facility-administered encounter medications on file as of 08/26/2023.    Allergies  Allergen Reactions   Keflex [Cephalexin] Nausea Only   Sulfa Antibiotics Hives    Pertinent ROS per HPI, otherwise unremarkable      Objective:  BP (!) 146/78   Pulse 92   Temp 97.9 F (36.6 C)   Ht 5\' 1"  (1.549 m)   Wt 262 lb 3.2 oz (118.9 kg)   SpO2 96%   BMI 49.54 kg/m    Wt Readings from Last 3 Encounters:  08/26/23 262 lb 3.2 oz (118.9 kg)  05/28/23 260 lb (117.9 kg)  05/01/23 261 lb 12.8 oz (118.8 kg)    Physical Exam Vitals and nursing note reviewed.  Constitutional:      General: She is not in acute distress.    Appearance: Normal appearance. She is obese. She is ill-appearing. She is not toxic-appearing or diaphoretic.  HENT:     Head: Normocephalic and atraumatic.     Right Ear: A middle ear effusion is present. Tympanic membrane is not erythematous.     Left Ear: A middle ear effusion is present. Tympanic membrane is not erythematous.     Nose: Congestion and rhinorrhea present. Rhinorrhea is purulent.     Right Sinus: Maxillary sinus tenderness and frontal sinus tenderness present.     Left Sinus: Maxillary sinus tenderness and frontal sinus tenderness present.     Mouth/Throat:     Lips: Pink.     Mouth: Mucous membranes are moist.     Pharynx: Posterior oropharyngeal erythema present. No pharyngeal swelling or oropharyngeal exudate.  Eyes:     General:        Right eye: Discharge present.        Left eye: Discharge present.    Conjunctiva/sclera:     Right eye: Right conjunctiva is injected.     Left eye: Left conjunctiva is injected.     Pupils: Pupils are equal, round, and reactive to light.  Cardiovascular:     Rate and Rhythm: Normal rate and regular rhythm.  Pulmonary:     Effort: Pulmonary effort is normal.     Breath sounds: Wheezing present.  Musculoskeletal:     Cervical back: Neck supple.  Lymphadenopathy:     Cervical: No cervical adenopathy.   Skin:    General: Skin is warm and dry.     Capillary Refill: Capillary refill takes less than 2 seconds.  Neurological:     General: No focal deficit present.     Mental Status: She is alert and oriented to person, place, and time.  Psychiatric:        Mood and Affect: Mood normal.        Behavior: Behavior normal.        Thought Content: Thought content normal.  Judgment: Judgment normal.     Results for orders placed or performed in visit on 09/04/22  CBC with Differential/Platelet   Collection Time: 09/04/22 10:25 AM  Result Value Ref Range   WBC 11.7 (H) 3.4 - 10.8 x10E3/uL   RBC 5.44 (H) 3.77 - 5.28 x10E6/uL   Hemoglobin 14.6 11.1 - 15.9 g/dL   Hematocrit 41.3 24.4 - 46.6 %   MCV 83 79 - 97 fL   MCH 26.8 26.6 - 33.0 pg   MCHC 32.2 31.5 - 35.7 g/dL   RDW 01.0 27.2 - 53.6 %   Platelets 291 150 - 450 x10E3/uL   Neutrophils 65 Not Estab. %   Lymphs 24 Not Estab. %   Monocytes 7 Not Estab. %   Eos 2 Not Estab. %   Basos 1 Not Estab. %   Neutrophils Absolute 7.6 (H) 1.4 - 7.0 x10E3/uL   Lymphocytes Absolute 2.8 0.7 - 3.1 x10E3/uL   Monocytes Absolute 0.8 0.1 - 0.9 x10E3/uL   EOS (ABSOLUTE) 0.3 0.0 - 0.4 x10E3/uL   Basophils Absolute 0.1 0.0 - 0.2 x10E3/uL   Immature Granulocytes 1 Not Estab. %   Immature Grans (Abs) 0.1 0.0 - 0.1 x10E3/uL  CMP14+EGFR   Collection Time: 09/04/22 10:25 AM  Result Value Ref Range   Glucose 85 70 - 99 mg/dL   BUN 12 6 - 24 mg/dL   Creatinine, Ser 6.44 (L) 0.57 - 1.00 mg/dL   eGFR 034 >74 QV/ZDG/3.87   BUN/Creatinine Ratio 21 9 - 23   Sodium 137 134 - 144 mmol/L   Potassium 4.5 3.5 - 5.2 mmol/L   Chloride 100 96 - 106 mmol/L   CO2 22 20 - 29 mmol/L   Calcium 9.6 8.7 - 10.2 mg/dL   Total Protein 6.8 6.0 - 8.5 g/dL   Albumin 4.2 3.9 - 4.9 g/dL   Globulin, Total 2.6 1.5 - 4.5 g/dL   Albumin/Globulin Ratio 1.6 1.2 - 2.2   Bilirubin Total 0.3 0.0 - 1.2 mg/dL   Alkaline Phosphatase 120 44 - 121 IU/L   AST 24 0 - 40 IU/L   ALT  24 0 - 32 IU/L  Lipid panel   Collection Time: 09/04/22 10:25 AM  Result Value Ref Range   Cholesterol, Total 251 (H) 100 - 199 mg/dL   Triglycerides 564 (H) 0 - 149 mg/dL   HDL 48 >33 mg/dL   VLDL Cholesterol Cal 38 5 - 40 mg/dL   LDL Chol Calc (NIH) 295 (H) 0 - 99 mg/dL   Chol/HDL Ratio 5.2 (H) 0.0 - 4.4 ratio  VITAMIN D 25 Hydroxy (Vit-D Deficiency, Fractures)   Collection Time: 09/04/22 10:25 AM  Result Value Ref Range   Vit D, 25-Hydroxy 13.8 (L) 30.0 - 100.0 ng/mL  Urinalysis   Collection Time: 09/04/22 10:34 AM  Result Value Ref Range   Specific Gravity, UA 1.025 1.005 - 1.030   pH, UA 6.5 5.0 - 7.5   Color, UA Yellow Yellow   Appearance Ur Clear Clear   Leukocytes,UA Negative Negative   Protein,UA Trace (A) Negative/Trace   Glucose, UA Negative Negative   Ketones, UA Negative Negative   RBC, UA Negative Negative   Bilirubin, UA Negative Negative   Urobilinogen, Ur 0.2 0.2 - 1.0 mg/dL   Nitrite, UA Negative Negative  Cytology - PAP([facility])   Collection Time: 09/04/22 11:03 AM  Result Value Ref Range   High risk HPV Negative    Adequacy      Satisfactory for evaluation;  transformation zone component PRESENT.   Diagnosis (A)     - Atypical squamous cells of undetermined significance (ASC-US)   Comment Normal Reference Range HPV - Negative        Pertinent labs & imaging results that were available during my care of the patient were reviewed by me and considered in my medical decision making.  Assessment & Plan:  Tamara Macias was seen today for conjunctivitis and nasal congestion.  Diagnoses and all orders for this visit:  URI with cough and congestion -     azithromycin (ZITHROMAX) 500 MG tablet; Take 1 tablet (500 mg total) by mouth daily for 3 days. -     predniSONE (DELTASONE) 20 MG tablet; Take 2 tablets (40 mg total) by mouth daily with breakfast for 5 days.  Bacterial conjunctivitis of both eyes -     trimethoprim-polymyxin b (POLYTRIM) ophthalmic  solution; Place 1 drop into both eyes in the morning, at noon, in the evening, and at bedtime for 7 days.  Wheezing -     azithromycin (ZITHROMAX) 500 MG tablet; Take 1 tablet (500 mg total) by mouth daily for 3 days. -     predniSONE (DELTASONE) 20 MG tablet; Take 2 tablets (40 mg total) by mouth daily with breakfast for 5 days.     Assessment and Plan    Bacterial Conjunctivitis Bilateral eye burning, redness, crusting, and discharge consistent with bacterial conjunctivitis. No vision changes. Likely unrelated to work environment or cat exposure. Discussed Polytrim eye drops, an antibiotic for infection and discomfort. - Prescribe Polytrim eye drops, 4 times a day for 7 days - Advise to monitor symptoms and report if no improvement or worsening  Upper Respiratory Infection (URI) Nasal congestion, dark nasal discharge, and sore throat initially, now resolved. Persistent symptoms despite OTC medications. Mild wheezing likely exacerbated by asthma. Discussed azithromycin and prednisone for treatment. - Prescribe azithromycin 500 mg once daily for 3 days - Prescribe prednisone, 2 pills every morning for an unspecified duration - Continue OTC medications for cough and congestion - Advise to monitor symptoms and report if no improvement or worsening  Asthma Mild wheezing noted, exacerbated by current URI. Emphasized importance of monitoring asthma symptoms during URI treatment. - Monitor asthma symptoms closely during URI treatment - Continue current asthma medications as prescribed  Follow-up - Follow up with Dr. Darlyn Read if symptoms persist or worsen - Consider prescribing Optivar (Astelin) eye drops if allergic symptoms develop or persist after current treatment.          Continue all other maintenance medications.  Follow up plan: Return if symptoms worsen or fail to improve.   Continue healthy lifestyle choices, including diet (rich in fruits, vegetables, and lean proteins,  and low in salt and simple carbohydrates) and exercise (at least 30 minutes of moderate physical activity daily).  Educational handout given for URI, conjunctivitis   The above assessment and management plan was discussed with the patient. The patient verbalized understanding of and has agreed to the management plan. Patient is aware to call the clinic if they develop any new symptoms or if symptoms persist or worsen. Patient is aware when to return to the clinic for a follow-up visit. Patient educated on when it is appropriate to go to the emergency department.   Kari Baars, FNP-C [facility] [phone]

## 2023-09-15 ENCOUNTER — Other Ambulatory Visit: Payer: Self-pay | Admitting: Family Medicine

## 2023-09-15 ENCOUNTER — Ambulatory Visit: Payer: Self-pay | Admitting: Family Medicine

## 2023-09-15 MED ORDER — BENZONATATE 200 MG PO CAPS: 200.0000 mg | ORAL_CAPSULE | Freq: Three times a day (TID) | ORAL | 0 refills | Status: DC | PRN

## 2023-09-15 NOTE — Telephone Encounter (Signed)
 Left vm for pt

## 2023-09-15 NOTE — Telephone Encounter (Signed)
 Please let the patient know that I sent their prescription to their pharmacy. Thanks, WS

## 2023-09-15 NOTE — Telephone Encounter (Signed)
 Chief Complaint: cough Symptoms: dry cough, runny nose, intermittent sore throat Frequency: x 3 weeks Pertinent Negatives: Patient denies SOB, wheezing, fever, nausea, vomiting, diarrhea Disposition: [] ED /[] Urgent Care (no appt availability in office) / [x] Appointment(In office/virtual)/ []  [facility]/ [] Home Care/ [] Refused Recommended Disposition /[] [facility]/ []  Follow-up with PCP Additional Notes: Patient seen on 08/26/23 for URI. She states she has been using her inhaler but it doesn't seem to be helping much, states she has not been taking her singulair but needs to. She states she completed the prednisone and felt better but once she was done with the medicine, her cough came back and her nose has been running. She states she has also tried Robitussin OTC. Patient agreeable to acute visit, states she works tomorrow so the soonest she can come in is Wednesday. Patient scheduled with PCP [facility] on Wednesday 09/17/23. Patient asking for some cough medicine to be prescribed in the meantime to get her thru today and tomorrow.  Copied from CRM [id number]. Topic: Clinical - Medication Question >> Sep 15, 2023  9:38 AM Clayton Bibles wrote: Reason for CRM: She still has a dry cough and she is trying over the counter medication and it does not help. She wants Dr. Darlyn Read to call her in some cough syrup to [facility]. Please send her a message through MyChart Reason for Disposition  Cough has been present for > 3 weeks  Answer Assessment - Initial Assessment Questions 1. ONSET: "When did the cough begin?"      "Ever since my cold", seen on 08/26/23.  2. SEVERITY: "How bad is the cough today?"      Dry, irritating cough.  3. SPUTUM: "Describe the color of your sputum" (none, dry cough; clear, white, yellow, green)     Dry, sometimes clear or white mucus.  4. HEMOPTYSIS: "Are you coughing up any blood?" If so ask: "How much?" (flecks, streaks, tablespoons, etc.)     Denies.  5.  DIFFICULTY BREATHING: "Are you having difficulty breathing?" If Yes, ask: "How bad is it?" (e.g., mild, moderate, severe)    - MILD: No SOB at rest, mild SOB with walking, speaks normally in sentences, can lie down, no retractions, pulse < 100.    - MODERATE: SOB at rest, SOB with minimal exertion and prefers to sit, cannot lie down flat, speaks in phrases, mild retractions, audible wheezing, pulse 100-120.    - SEVERE: Very SOB at rest, speaks in single words, struggling to breathe, sitting hunched forward, retractions, pulse > 120      Denies.  6. FEVER: "Do you have a fever?" If Yes, ask: "What is your temperature, how was it measured, and when did it start?"     Denies.  7. CARDIAC HISTORY: "Do you have any history of heart disease?" (e.g., heart attack, congestive heart failure)      High blood pressure.  8. LUNG HISTORY: "Do you have any history of lung disease?"  (e.g., pulmonary embolus, asthma, emphysema)     Asthma.  9. PE RISK FACTORS: "Do you have a history of blood clots?" (or: recent major surgery, recent prolonged travel, bedridden)     Denies.  10. OTHER SYMPTOMS: "Do you have any other symptoms?" (e.g., runny nose, wheezing, chest pain)       Denies.  11. PREGNANCY: "Is there any chance you are pregnant?" "When was your last menstrual period?"       LMP: unsure, states she thinks they have stopped.  12. TRAVEL: "  Have you traveled out of the country in the last month?" (e.g., travel history, exposures)       Denies.  Protocols used: Cough - Acute Non-Productive-A-AH

## 2023-09-17 ENCOUNTER — Ambulatory Visit: Admitting: Family Medicine

## 2023-09-17 ENCOUNTER — Encounter: Payer: Self-pay | Admitting: Family Medicine

## 2023-09-17 VITALS — BP 121/65 | HR 73 | Temp 98.0°F | Ht 61.0 in | Wt 269.0 lb

## 2023-09-17 DIAGNOSIS — J329 Chronic sinusitis, unspecified: Secondary | ICD-10-CM

## 2023-09-17 DIAGNOSIS — J40 Bronchitis, not specified as acute or chronic: Secondary | ICD-10-CM

## 2023-09-17 MED ORDER — AMOXICILLIN-POT CLAVULANATE 875-125 MG PO TABS: 1.0000 | ORAL_TABLET | Freq: Two times a day (BID) | ORAL | 0 refills | Status: DC

## 2023-09-17 MED ORDER — BETAMETHASONE SOD PHOS & ACET 6 (3-3) MG/ML IJ SUSP
6.0000 mg | Freq: Once | INTRAMUSCULAR | Status: AC
  Administered 2023-09-17: 6 mg via INTRAMUSCULAR

## 2023-09-17 NOTE — Progress Notes (Addendum)
 Chief Complaint  Patient presents with   sick    Cold 3 weeks ago. Runny nose and dry cough. Small amounts yellow mucous. Medication we called in is helping. No SOB.    HPI  Patient presents today for Patient presents with upper respiratory congestion. Rhinorrhea that is frequently purulent. There is moderate sore throat. Patient reports coughing frequently as well.  Scant sputum noted. There is no fever, chills or sweats. The patient denies being short of breath. Onset was 21 days ago. Gradually worsening. Zpack & pred helped some  PMH: Smoking status noted ROS: Per HPI  Objective: BP 121/65   Pulse 73   Temp 98 F (36.7 C)   Ht 5\' 1"  (1.549 m)   Wt 269 lb (122 kg)   SpO2 99%   BMI 50.83 kg/m  Gen: NAD, alert, cooperative with exam HEENT: NCAT, Nasal passages swollen, red TMS RED CV: RRR, good S1/S2, no murmur Resp: Bronchitis changes with scattered wheezes, non-labored Ext: No edema, warm Neuro: Alert and oriented, No gross deficits  Assessment and plan:  1. Sinobronchitis     Meds ordered this encounter  Medications   amoxicillin-clavulanate (AUGMENTIN) 875-125 MG tablet    Sig: Take 1 tablet by mouth 2 (two) times daily. Take all of this medication    Dispense:  20 tablet    Refill:  0   betamethasone acetate-betamethasone sodium phosphate (CELESTONE) injection 6 mg      Follow up as needed.  Mechele Claude, MD

## 2023-10-29 ENCOUNTER — Other Ambulatory Visit: Payer: Self-pay | Admitting: Family Medicine

## 2023-10-29 ENCOUNTER — Ambulatory Visit (INDEPENDENT_AMBULATORY_CARE_PROVIDER_SITE_OTHER): Admitting: Family Medicine

## 2023-10-29 ENCOUNTER — Encounter: Payer: Self-pay | Admitting: Family Medicine

## 2023-10-29 VITALS — BP 139/74 | HR 73 | Temp 98.0°F | Ht 61.0 in | Wt 264.0 lb

## 2023-10-29 DIAGNOSIS — Z1211 Encounter for screening for malignant neoplasm of colon: Secondary | ICD-10-CM

## 2023-10-29 DIAGNOSIS — J4541 Moderate persistent asthma with (acute) exacerbation: Secondary | ICD-10-CM

## 2023-10-29 DIAGNOSIS — I10 Essential (primary) hypertension: Secondary | ICD-10-CM | POA: Diagnosis not present

## 2023-10-29 DIAGNOSIS — E785 Hyperlipidemia, unspecified: Secondary | ICD-10-CM | POA: Diagnosis not present

## 2023-10-29 DIAGNOSIS — E559 Vitamin D deficiency, unspecified: Secondary | ICD-10-CM

## 2023-10-29 DIAGNOSIS — R809 Proteinuria, unspecified: Secondary | ICD-10-CM

## 2023-10-29 DIAGNOSIS — Z0000 Encounter for general adult medical examination without abnormal findings: Secondary | ICD-10-CM

## 2023-10-29 DIAGNOSIS — Z0001 Encounter for general adult medical examination with abnormal findings: Secondary | ICD-10-CM | POA: Diagnosis not present

## 2023-10-29 DIAGNOSIS — Z6841 Body Mass Index (BMI) 40.0 and over, adult: Secondary | ICD-10-CM

## 2023-10-29 LAB — URINALYSIS
Bilirubin, UA: NEGATIVE
Glucose, UA: NEGATIVE
Ketones, UA: NEGATIVE
Leukocytes,UA: NEGATIVE
Nitrite, UA: NEGATIVE
Protein,UA: NEGATIVE
RBC, UA: NEGATIVE
Specific Gravity, UA: 1.02 (ref 1.005–1.030)
Urobilinogen, Ur: 0.2 mg/dL (ref 0.2–1.0)
pH, UA: 6 (ref 5.0–7.5)

## 2023-10-29 LAB — LIPID PANEL

## 2023-10-29 MED ORDER — FLUOCINONIDE 0.05 % EX CREA: 1.0000 | TOPICAL_CREAM | Freq: Two times a day (BID) | CUTANEOUS | 0 refills | Status: AC

## 2023-10-29 MED ORDER — MONTELUKAST SODIUM 10 MG PO TABS: 10.0000 mg | ORAL_TABLET | Freq: Every day | ORAL | 2 refills | Status: AC

## 2023-10-29 MED ORDER — LISINOPRIL 10 MG PO TABS: 10.0000 mg | ORAL_TABLET | Freq: Every day | ORAL | 0 refills | Status: AC

## 2023-10-29 MED ORDER — NEXLIZET 180-10 MG PO TABS: 1.0000 | ORAL_TABLET | Freq: Every day | ORAL | 2 refills | Status: AC

## 2023-10-29 MED ORDER — ALBUTEROL SULFATE HFA 108 (90 BASE) MCG/ACT IN AERS: INHALATION_SPRAY | RESPIRATORY_TRACT | 0 refills | Status: DC

## 2023-10-29 NOTE — Telephone Encounter (Signed)
 Name from pharmacy: [facility]        [facility]  Pharmacy comment: NDC not covered by insurance. xopenex is covered.

## 2023-10-29 NOTE — Progress Notes (Signed)
 Subjective:  Patient ID: Tamara Macias, female    DOB: Nov 16, 1975  Age: 48 y.o. MRN: [id number]  CC: Annual Exam   HPI Tamara Macias presents for Annual exam, no GYN.     09/17/2023    9:28 AM 09/04/2022    9:16 AM 04/17/2022   10:40 AM  Depression screen PHQ 2/9  Decreased Interest 0 0 0  Down, Depressed, Hopeless 0 0 0  PHQ - 2 Score 0 0 0  Altered sleeping 0    Tired, decreased energy 0    Change in appetite 0    Feeling bad or failure about yourself  0    Trouble concentrating 0    Moving slowly or fidgety/restless 0    Suicidal thoughts 0    PHQ-9 Score 0    Difficult doing work/chores Not difficult at all      History Tamara Macias has a past medical history of Hyperlipidemia and Hypertension.   She has no past surgical history on file.   Her family history includes COPD in her father; Hypertension in her father.She reports that she has never smoked. She has never used smokeless tobacco. She reports that she does not drink alcohol and does not use drugs.    ROS Review of Systems  Constitutional:  Negative for appetite change, chills, diaphoresis, fatigue, fever and unexpected weight change.  HENT:  Negative for congestion, ear pain, hearing loss, postnasal drip, rhinorrhea, sneezing, sore throat and trouble swallowing.   Eyes:  Negative for pain.  Respiratory:  Negative for cough, chest tightness and shortness of breath.   Cardiovascular:  Negative for chest pain and palpitations.  Gastrointestinal:  Negative for abdominal pain, constipation, diarrhea, nausea and vomiting.  Endocrine: Negative for cold intolerance, heat intolerance, polydipsia, polyphagia and polyuria.  Genitourinary:  Negative for dysuria, frequency and menstrual problem.  Musculoskeletal:  Negative for arthralgias and joint swelling.  Skin:  Negative for rash.  Allergic/Immunologic: Negative for environmental allergies.  Neurological:  Negative for dizziness, weakness, numbness and  headaches.  Psychiatric/Behavioral:  Negative for agitation and dysphoric mood.     Objective:  BP 139/74   Pulse 73   Temp 98 F (36.7 C)   Ht 5\' 1"  (1.549 m)   Wt 264 lb (119.7 kg)   SpO2 96%   BMI 49.88 kg/m   BP Readings from Last 3 Encounters:  10/29/23 139/74  09/17/23 121/65  08/26/23 (!) 146/78    Wt Readings from Last 3 Encounters:  10/29/23 264 lb (119.7 kg)  09/17/23 269 lb (122 kg)  08/26/23 262 lb 3.2 oz (118.9 kg)     Physical Exam   Assessment & Plan:  Well adult exam -     CBC with Differential/Platelet -     CMP14+EGFR -     Lipid panel -     VITAMIN D  25 Hydroxy (Vit-D Deficiency, Fractures) -     Urinalysis  Essential hypertension -     CBC with Differential/Platelet -     Lisinopril ; Take 1 tablet (10 mg total) by mouth daily. **NEEDS TO BE SEEN BEFORE NEXT REFILL**  Dispense: 30 tablet; Refill: 0  Hyperlipidemia with target LDL less than 100 -     CMP14+EGFR -     Lipid panel  Morbid obesity with BMI of 45.0-49.9, adult (HCC) -     CBC with Differential/Platelet  Vitamin D  deficiency -     VITAMIN D  25 Hydroxy (Vit-D Deficiency, Fractures)  Proteinuria, unspecified type -  Urinalysis  Moderate persistent asthma with acute exacerbation -     Albuterol  Sulfate HFA; INHALE 1 TO 2 PUFFS BY MOUTH EVERY 6 HOURS AS NEEDED FOR WHEEZING **NEEDS TO BE SEEN BEFORE NEXT REFILL**  Dispense: 18 g; Refill: 0 -     Montelukast  Sodium; Take 1 tablet (10 mg total) by mouth at bedtime.  Dispense: 30 tablet; Refill: 2  Screening for colon cancer -     Cologuard  Other orders -     Nexlizet ; Take 1 tablet by mouth daily.  Dispense: 90 tablet; Refill: 2 -     Fluocinonide; Apply 1 Application topically 2 (two) times daily.  Dispense: 30 g; Refill: 0     Follow-up: No follow-ups on file.  Tamara Macias, M.D.

## 2023-10-30 LAB — CBC WITH DIFFERENTIAL/PLATELET
Basophils Absolute: 0.1 10*3/uL (ref 0.0–0.2)
Basos: 1 %
EOS (ABSOLUTE): 0.3 10*3/uL (ref 0.0–0.4)
Eos: 3 %
Hematocrit: 44.8 % (ref 34.0–46.6)
Hemoglobin: 14.5 g/dL (ref 11.1–15.9)
Immature Grans (Abs): 0.1 10*3/uL (ref 0.0–0.1)
Immature Granulocytes: 1 %
Lymphocytes Absolute: 2.7 10*3/uL (ref 0.7–3.1)
Lymphs: 24 %
MCH: 27.2 pg (ref 26.6–33.0)
MCHC: 32.4 g/dL (ref 31.5–35.7)
MCV: 84 fL (ref 79–97)
Monocytes Absolute: 0.9 10*3/uL (ref 0.1–0.9)
Monocytes: 8 %
Neutrophils Absolute: 7.1 10*3/uL — ABNORMAL HIGH (ref 1.4–7.0)
Neutrophils: 63 %
Platelets: 243 10*3/uL (ref 150–450)
RBC: 5.33 x10E6/uL — ABNORMAL HIGH (ref 3.77–5.28)
RDW: 14.8 % (ref 11.7–15.4)
WBC: 11.1 10*3/uL — ABNORMAL HIGH (ref 3.4–10.8)

## 2023-10-30 LAB — VITAMIN D 25 HYDROXY (VIT D DEFICIENCY, FRACTURES): Vit D, 25-Hydroxy: 16.9 ng/mL — ABNORMAL LOW (ref 30.0–100.0)

## 2023-10-30 LAB — CMP14+EGFR
ALT: 25 IU/L (ref 0–32)
AST: 23 IU/L (ref 0–40)
Albumin: 4.5 g/dL (ref 3.9–4.9)
Alkaline Phosphatase: 120 IU/L (ref 44–121)
BUN/Creatinine Ratio: 19 (ref 9–23)
BUN: 12 mg/dL (ref 6–24)
Bilirubin Total: 0.3 mg/dL (ref 0.0–1.2)
CO2: 22 mmol/L (ref 20–29)
Calcium: 9.8 mg/dL (ref 8.7–10.2)
Chloride: 100 mmol/L (ref 96–106)
Creatinine, Ser: 0.62 mg/dL (ref 0.57–1.00)
Globulin, Total: 2.4 g/dL (ref 1.5–4.5)
Glucose: 95 mg/dL (ref 70–99)
Potassium: 4.7 mmol/L (ref 3.5–5.2)
Sodium: 140 mmol/L (ref 134–144)
Total Protein: 6.9 g/dL (ref 6.0–8.5)
eGFR: 112 mL/min/{1.73_m2} (ref >59)

## 2023-10-30 LAB — LIPID PANEL
Cholesterol, Total: 248 mg/dL — ABNORMAL HIGH (ref 100–199)
HDL: 39 mg/dL — ABNORMAL LOW (ref >39)
LDL CALC COMMENT:: 6.4 ratio — ABNORMAL HIGH (ref 0.0–4.4)
LDL Chol Calc (NIH): 173 mg/dL — ABNORMAL HIGH (ref 0–99)
Triglycerides: 192 mg/dL — ABNORMAL HIGH (ref 0–149)
VLDL Cholesterol Cal: 36 mg/dL (ref 5–40)

## 2023-11-02 ENCOUNTER — Encounter: Payer: Self-pay | Admitting: Family Medicine

## 2023-11-02 NOTE — Progress Notes (Signed)
 Dear Tamara Macias, Your Vitamin D  is  low. You need a prescription strength supplement I will send that in for you.Also your cholesterol was too high.  You may not have been on the Nexliset long enough to see the full response.  Be sure to take it regularly as directed for the next 90 days and we will check it again to see if it is getting you to your recommended cholesterol level. Nurse, if at all possible, could you send in a prescription for the patient for vitamin D  50,000 units, 1 p.o. weekly #13 with 3 refills? Many thanks, WS

## 2023-11-03 ENCOUNTER — Other Ambulatory Visit: Payer: Self-pay

## 2023-11-03 DIAGNOSIS — E559 Vitamin D deficiency, unspecified: Secondary | ICD-10-CM

## 2023-11-03 MED ORDER — VITAMIN D (ERGOCALCIFEROL) 1.25 MG (50000 UNIT) PO CAPS: 50000.0000 [IU] | ORAL_CAPSULE | ORAL | 3 refills | Status: AC

## 2023-11-08 LAB — COLOGUARD: COLOGUARD: NEGATIVE

## 2024-05-03 ENCOUNTER — Encounter: Payer: Self-pay | Admitting: Radiology
